# Patient Record
Sex: Male | Born: 1980 | Race: Black or African American | Hispanic: No | Smoking: Never smoker
Health system: Southern US, Community
[De-identification: ages and names within clinical notes are randomized; demographics above are authoritative.]

## PROBLEM LIST (undated history)

## (undated) DIAGNOSIS — F419 Anxiety disorder, unspecified: Secondary | ICD-10-CM

## (undated) DIAGNOSIS — R569 Unspecified convulsions: Secondary | ICD-10-CM

## (undated) DIAGNOSIS — J45909 Unspecified asthma, uncomplicated: Secondary | ICD-10-CM

## (undated) DIAGNOSIS — F329 Major depressive disorder, single episode, unspecified: Secondary | ICD-10-CM

## (undated) DIAGNOSIS — F909 Attention-deficit hyperactivity disorder, unspecified type: Secondary | ICD-10-CM

## (undated) HISTORY — PX: FRACTURE SURGERY: SHX138

---

## 2004-11-21 ENCOUNTER — Emergency Department: Payer: Self-pay | Admitting: Emergency Medicine

## 2007-03-15 ENCOUNTER — Emergency Department: Payer: Self-pay | Admitting: Internal Medicine

## 2007-03-15 ENCOUNTER — Other Ambulatory Visit: Payer: Self-pay

## 2007-07-05 ENCOUNTER — Emergency Department: Payer: Self-pay | Admitting: Emergency Medicine

## 2008-03-01 ENCOUNTER — Emergency Department: Payer: Self-pay | Admitting: Emergency Medicine

## 2008-03-24 ENCOUNTER — Emergency Department: Payer: Self-pay | Admitting: Emergency Medicine

## 2008-03-24 ENCOUNTER — Other Ambulatory Visit: Payer: Self-pay

## 2008-03-25 ENCOUNTER — Inpatient Hospital Stay: Payer: Self-pay | Admitting: Psychiatry

## 2008-03-25 ENCOUNTER — Other Ambulatory Visit: Payer: Self-pay

## 2008-12-17 ENCOUNTER — Emergency Department: Payer: Self-pay | Admitting: Unknown Physician Specialty

## 2009-07-06 ENCOUNTER — Emergency Department: Payer: Self-pay | Admitting: Emergency Medicine

## 2009-07-07 ENCOUNTER — Ambulatory Visit: Payer: Self-pay | Admitting: Emergency Medicine

## 2009-07-07 ENCOUNTER — Emergency Department: Payer: Self-pay | Admitting: Unknown Physician Specialty

## 2009-10-12 ENCOUNTER — Emergency Department: Payer: Self-pay

## 2010-03-10 ENCOUNTER — Emergency Department: Payer: Self-pay | Admitting: Emergency Medicine

## 2010-03-28 ENCOUNTER — Emergency Department: Payer: Self-pay | Admitting: Emergency Medicine

## 2010-04-05 ENCOUNTER — Ambulatory Visit: Payer: Self-pay | Admitting: Neurology

## 2010-06-08 ENCOUNTER — Emergency Department: Payer: Self-pay | Admitting: Internal Medicine

## 2010-08-03 ENCOUNTER — Emergency Department: Payer: Self-pay | Admitting: Emergency Medicine

## 2011-03-30 ENCOUNTER — Emergency Department: Payer: Self-pay | Admitting: Emergency Medicine

## 2011-05-05 ENCOUNTER — Emergency Department: Payer: Self-pay | Admitting: Unknown Physician Specialty

## 2012-03-27 ENCOUNTER — Ambulatory Visit: Payer: Self-pay | Admitting: Internal Medicine

## 2012-12-18 DIAGNOSIS — I10 Essential (primary) hypertension: Secondary | ICD-10-CM | POA: Diagnosis not present

## 2012-12-18 DIAGNOSIS — G4733 Obstructive sleep apnea (adult) (pediatric): Secondary | ICD-10-CM | POA: Diagnosis not present

## 2013-01-08 ENCOUNTER — Ambulatory Visit: Payer: Self-pay | Admitting: Internal Medicine

## 2013-01-08 DIAGNOSIS — G4733 Obstructive sleep apnea (adult) (pediatric): Secondary | ICD-10-CM | POA: Diagnosis not present

## 2013-01-09 DIAGNOSIS — F332 Major depressive disorder, recurrent severe without psychotic features: Secondary | ICD-10-CM | POA: Diagnosis not present

## 2013-01-16 DIAGNOSIS — F332 Major depressive disorder, recurrent severe without psychotic features: Secondary | ICD-10-CM | POA: Diagnosis not present

## 2013-04-11 DIAGNOSIS — F332 Major depressive disorder, recurrent severe without psychotic features: Secondary | ICD-10-CM | POA: Diagnosis not present

## 2013-05-01 DIAGNOSIS — F411 Generalized anxiety disorder: Secondary | ICD-10-CM | POA: Diagnosis not present

## 2013-05-01 DIAGNOSIS — IMO0002 Reserved for concepts with insufficient information to code with codable children: Secondary | ICD-10-CM | POA: Diagnosis not present

## 2013-05-01 DIAGNOSIS — L02419 Cutaneous abscess of limb, unspecified: Secondary | ICD-10-CM | POA: Diagnosis not present

## 2013-05-01 DIAGNOSIS — J45909 Unspecified asthma, uncomplicated: Secondary | ICD-10-CM | POA: Diagnosis not present

## 2013-06-11 DIAGNOSIS — IMO0002 Reserved for concepts with insufficient information to code with codable children: Secondary | ICD-10-CM | POA: Diagnosis not present

## 2013-06-11 DIAGNOSIS — J45909 Unspecified asthma, uncomplicated: Secondary | ICD-10-CM | POA: Diagnosis not present

## 2013-06-11 DIAGNOSIS — F411 Generalized anxiety disorder: Secondary | ICD-10-CM | POA: Diagnosis not present

## 2013-07-01 DIAGNOSIS — J40 Bronchitis, not specified as acute or chronic: Secondary | ICD-10-CM | POA: Diagnosis not present

## 2013-07-01 DIAGNOSIS — J3489 Other specified disorders of nose and nasal sinuses: Secondary | ICD-10-CM | POA: Diagnosis not present

## 2013-07-01 DIAGNOSIS — J45909 Unspecified asthma, uncomplicated: Secondary | ICD-10-CM | POA: Diagnosis not present

## 2013-07-01 DIAGNOSIS — J329 Chronic sinusitis, unspecified: Secondary | ICD-10-CM | POA: Diagnosis not present

## 2013-07-18 DIAGNOSIS — F332 Major depressive disorder, recurrent severe without psychotic features: Secondary | ICD-10-CM | POA: Diagnosis not present

## 2013-07-25 DIAGNOSIS — G4730 Sleep apnea, unspecified: Secondary | ICD-10-CM | POA: Diagnosis not present

## 2013-07-25 DIAGNOSIS — J45909 Unspecified asthma, uncomplicated: Secondary | ICD-10-CM | POA: Diagnosis not present

## 2013-07-25 DIAGNOSIS — M549 Dorsalgia, unspecified: Secondary | ICD-10-CM | POA: Diagnosis not present

## 2013-07-25 DIAGNOSIS — F329 Major depressive disorder, single episode, unspecified: Secondary | ICD-10-CM | POA: Diagnosis not present

## 2013-07-25 DIAGNOSIS — F3289 Other specified depressive episodes: Secondary | ICD-10-CM | POA: Diagnosis not present

## 2013-07-31 DIAGNOSIS — E669 Obesity, unspecified: Secondary | ICD-10-CM | POA: Diagnosis not present

## 2013-08-25 DIAGNOSIS — R7309 Other abnormal glucose: Secondary | ICD-10-CM | POA: Diagnosis not present

## 2013-08-25 DIAGNOSIS — E669 Obesity, unspecified: Secondary | ICD-10-CM | POA: Diagnosis not present

## 2013-08-25 DIAGNOSIS — G4730 Sleep apnea, unspecified: Secondary | ICD-10-CM | POA: Diagnosis not present

## 2013-08-25 DIAGNOSIS — M549 Dorsalgia, unspecified: Secondary | ICD-10-CM | POA: Diagnosis not present

## 2013-09-19 DIAGNOSIS — F332 Major depressive disorder, recurrent severe without psychotic features: Secondary | ICD-10-CM | POA: Diagnosis not present

## 2013-09-25 DIAGNOSIS — J45909 Unspecified asthma, uncomplicated: Secondary | ICD-10-CM | POA: Diagnosis not present

## 2013-09-25 DIAGNOSIS — E669 Obesity, unspecified: Secondary | ICD-10-CM | POA: Diagnosis not present

## 2013-10-30 DIAGNOSIS — Z713 Dietary counseling and surveillance: Secondary | ICD-10-CM | POA: Diagnosis not present

## 2013-10-30 DIAGNOSIS — J45909 Unspecified asthma, uncomplicated: Secondary | ICD-10-CM | POA: Diagnosis not present

## 2013-10-30 DIAGNOSIS — M549 Dorsalgia, unspecified: Secondary | ICD-10-CM | POA: Diagnosis not present

## 2013-11-05 DIAGNOSIS — F332 Major depressive disorder, recurrent severe without psychotic features: Secondary | ICD-10-CM | POA: Diagnosis not present

## 2013-11-05 DIAGNOSIS — F411 Generalized anxiety disorder: Secondary | ICD-10-CM | POA: Diagnosis not present

## 2013-11-12 DIAGNOSIS — F332 Major depressive disorder, recurrent severe without psychotic features: Secondary | ICD-10-CM | POA: Diagnosis not present

## 2013-11-25 DIAGNOSIS — F332 Major depressive disorder, recurrent severe without psychotic features: Secondary | ICD-10-CM | POA: Diagnosis not present

## 2013-12-02 DIAGNOSIS — F332 Major depressive disorder, recurrent severe without psychotic features: Secondary | ICD-10-CM | POA: Diagnosis not present

## 2014-01-17 DIAGNOSIS — F332 Major depressive disorder, recurrent severe without psychotic features: Secondary | ICD-10-CM | POA: Diagnosis not present

## 2014-02-16 DIAGNOSIS — F332 Major depressive disorder, recurrent severe without psychotic features: Secondary | ICD-10-CM | POA: Diagnosis not present

## 2014-03-12 DIAGNOSIS — F332 Major depressive disorder, recurrent severe without psychotic features: Secondary | ICD-10-CM | POA: Diagnosis not present

## 2014-04-01 DIAGNOSIS — E669 Obesity, unspecified: Secondary | ICD-10-CM | POA: Diagnosis not present

## 2014-04-01 DIAGNOSIS — J45909 Unspecified asthma, uncomplicated: Secondary | ICD-10-CM | POA: Diagnosis not present

## 2014-04-01 DIAGNOSIS — F3289 Other specified depressive episodes: Secondary | ICD-10-CM | POA: Diagnosis not present

## 2014-04-01 DIAGNOSIS — L600 Ingrowing nail: Secondary | ICD-10-CM | POA: Diagnosis not present

## 2014-04-01 DIAGNOSIS — G4730 Sleep apnea, unspecified: Secondary | ICD-10-CM | POA: Diagnosis not present

## 2014-04-01 DIAGNOSIS — F329 Major depressive disorder, single episode, unspecified: Secondary | ICD-10-CM | POA: Diagnosis not present

## 2014-04-01 DIAGNOSIS — M549 Dorsalgia, unspecified: Secondary | ICD-10-CM | POA: Diagnosis not present

## 2014-04-10 DIAGNOSIS — L600 Ingrowing nail: Secondary | ICD-10-CM | POA: Diagnosis not present

## 2014-04-10 DIAGNOSIS — L03032 Cellulitis of left toe: Secondary | ICD-10-CM | POA: Diagnosis not present

## 2014-04-14 DIAGNOSIS — R6889 Other general symptoms and signs: Secondary | ICD-10-CM | POA: Diagnosis not present

## 2014-04-16 DIAGNOSIS — F332 Major depressive disorder, recurrent severe without psychotic features: Secondary | ICD-10-CM | POA: Diagnosis not present

## 2014-04-28 DIAGNOSIS — F332 Major depressive disorder, recurrent severe without psychotic features: Secondary | ICD-10-CM | POA: Diagnosis not present

## 2014-04-28 DIAGNOSIS — Z79899 Other long term (current) drug therapy: Secondary | ICD-10-CM | POA: Diagnosis not present

## 2014-05-04 DIAGNOSIS — F332 Major depressive disorder, recurrent severe without psychotic features: Secondary | ICD-10-CM | POA: Diagnosis not present

## 2014-05-19 DIAGNOSIS — F332 Major depressive disorder, recurrent severe without psychotic features: Secondary | ICD-10-CM | POA: Diagnosis not present

## 2014-05-26 DIAGNOSIS — L750 Bromhidrosis: Secondary | ICD-10-CM | POA: Diagnosis not present

## 2014-06-01 DIAGNOSIS — J4520 Mild intermittent asthma, uncomplicated: Secondary | ICD-10-CM | POA: Diagnosis not present

## 2014-06-01 DIAGNOSIS — E669 Obesity, unspecified: Secondary | ICD-10-CM | POA: Diagnosis not present

## 2014-06-01 DIAGNOSIS — G4730 Sleep apnea, unspecified: Secondary | ICD-10-CM | POA: Diagnosis not present

## 2014-06-01 DIAGNOSIS — F329 Major depressive disorder, single episode, unspecified: Secondary | ICD-10-CM | POA: Diagnosis not present

## 2014-06-03 DIAGNOSIS — F332 Major depressive disorder, recurrent severe without psychotic features: Secondary | ICD-10-CM | POA: Diagnosis not present

## 2014-06-10 DIAGNOSIS — F332 Major depressive disorder, recurrent severe without psychotic features: Secondary | ICD-10-CM | POA: Diagnosis not present

## 2014-06-17 DIAGNOSIS — J309 Allergic rhinitis, unspecified: Secondary | ICD-10-CM | POA: Diagnosis not present

## 2014-06-17 DIAGNOSIS — J342 Deviated nasal septum: Secondary | ICD-10-CM | POA: Diagnosis not present

## 2014-06-17 DIAGNOSIS — R0683 Snoring: Secondary | ICD-10-CM | POA: Diagnosis not present

## 2014-07-07 DIAGNOSIS — F332 Major depressive disorder, recurrent severe without psychotic features: Secondary | ICD-10-CM | POA: Diagnosis not present

## 2014-07-14 DIAGNOSIS — F329 Major depressive disorder, single episode, unspecified: Secondary | ICD-10-CM | POA: Diagnosis not present

## 2014-07-14 DIAGNOSIS — Z1389 Encounter for screening for other disorder: Secondary | ICD-10-CM | POA: Diagnosis not present

## 2014-07-14 DIAGNOSIS — J4521 Mild intermittent asthma with (acute) exacerbation: Secondary | ICD-10-CM | POA: Diagnosis not present

## 2014-07-14 DIAGNOSIS — J4520 Mild intermittent asthma, uncomplicated: Secondary | ICD-10-CM | POA: Diagnosis not present

## 2014-07-14 DIAGNOSIS — E669 Obesity, unspecified: Secondary | ICD-10-CM | POA: Diagnosis not present

## 2014-07-16 DIAGNOSIS — F332 Major depressive disorder, recurrent severe without psychotic features: Secondary | ICD-10-CM | POA: Diagnosis not present

## 2014-07-21 DIAGNOSIS — L750 Bromhidrosis: Secondary | ICD-10-CM | POA: Diagnosis not present

## 2014-07-30 DIAGNOSIS — F332 Major depressive disorder, recurrent severe without psychotic features: Secondary | ICD-10-CM | POA: Diagnosis not present

## 2014-08-14 DIAGNOSIS — F332 Major depressive disorder, recurrent severe without psychotic features: Secondary | ICD-10-CM | POA: Diagnosis not present

## 2014-09-03 DIAGNOSIS — F332 Major depressive disorder, recurrent severe without psychotic features: Secondary | ICD-10-CM | POA: Diagnosis not present

## 2014-09-09 DIAGNOSIS — Z1389 Encounter for screening for other disorder: Secondary | ICD-10-CM | POA: Diagnosis not present

## 2014-09-09 DIAGNOSIS — F329 Major depressive disorder, single episode, unspecified: Secondary | ICD-10-CM | POA: Diagnosis not present

## 2014-09-09 DIAGNOSIS — M549 Dorsalgia, unspecified: Secondary | ICD-10-CM | POA: Diagnosis not present

## 2014-09-09 DIAGNOSIS — G8929 Other chronic pain: Secondary | ICD-10-CM | POA: Diagnosis not present

## 2014-09-14 ENCOUNTER — Ambulatory Visit: Payer: Self-pay

## 2014-09-14 DIAGNOSIS — M545 Low back pain: Secondary | ICD-10-CM | POA: Diagnosis not present

## 2014-09-14 DIAGNOSIS — Z23 Encounter for immunization: Secondary | ICD-10-CM | POA: Diagnosis not present

## 2014-09-14 DIAGNOSIS — M549 Dorsalgia, unspecified: Secondary | ICD-10-CM | POA: Diagnosis not present

## 2014-09-14 DIAGNOSIS — Z981 Arthrodesis status: Secondary | ICD-10-CM | POA: Diagnosis not present

## 2014-09-17 DIAGNOSIS — F332 Major depressive disorder, recurrent severe without psychotic features: Secondary | ICD-10-CM | POA: Diagnosis not present

## 2014-10-01 DIAGNOSIS — F332 Major depressive disorder, recurrent severe without psychotic features: Secondary | ICD-10-CM | POA: Diagnosis not present

## 2014-10-08 DIAGNOSIS — F332 Major depressive disorder, recurrent severe without psychotic features: Secondary | ICD-10-CM | POA: Diagnosis not present

## 2014-10-14 ENCOUNTER — Emergency Department
Admit: 2014-10-14 | Disposition: A | Discharge status: Other Acute Inpatient Hospital | Payer: Self-pay | Admitting: Emergency Medicine

## 2014-10-14 DIAGNOSIS — S32059A Unspecified fracture of fifth lumbar vertebra, initial encounter for closed fracture: Secondary | ICD-10-CM | POA: Diagnosis not present

## 2014-10-14 DIAGNOSIS — S329XXA Fracture of unspecified parts of lumbosacral spine and pelvis, initial encounter for closed fracture: Secondary | ICD-10-CM | POA: Diagnosis not present

## 2014-10-14 DIAGNOSIS — R51 Headache: Secondary | ICD-10-CM | POA: Diagnosis not present

## 2014-10-14 DIAGNOSIS — T1490 Injury, unspecified: Secondary | ICD-10-CM | POA: Diagnosis not present

## 2014-10-14 DIAGNOSIS — S199XXA Unspecified injury of neck, initial encounter: Secondary | ICD-10-CM | POA: Diagnosis not present

## 2014-10-14 DIAGNOSIS — M549 Dorsalgia, unspecified: Secondary | ICD-10-CM | POA: Diagnosis not present

## 2014-10-14 DIAGNOSIS — S3992XA Unspecified injury of lower back, initial encounter: Secondary | ICD-10-CM | POA: Diagnosis not present

## 2014-10-14 DIAGNOSIS — M25552 Pain in left hip: Secondary | ICD-10-CM | POA: Diagnosis not present

## 2014-10-14 DIAGNOSIS — S0990XA Unspecified injury of head, initial encounter: Secondary | ICD-10-CM | POA: Diagnosis not present

## 2014-10-14 DIAGNOSIS — S32502A Unspecified fracture of left pubis, initial encounter for closed fracture: Secondary | ICD-10-CM | POA: Diagnosis not present

## 2014-10-14 DIAGNOSIS — M542 Cervicalgia: Secondary | ICD-10-CM | POA: Diagnosis not present

## 2014-10-14 DIAGNOSIS — S32058A Other fracture of fifth lumbar vertebra, initial encounter for closed fracture: Secondary | ICD-10-CM | POA: Diagnosis not present

## 2014-10-14 DIAGNOSIS — S3993XA Unspecified injury of pelvis, initial encounter: Secondary | ICD-10-CM | POA: Diagnosis not present

## 2014-10-14 DIAGNOSIS — S32592A Other specified fracture of left pubis, initial encounter for closed fracture: Secondary | ICD-10-CM | POA: Diagnosis not present

## 2014-10-15 ENCOUNTER — Inpatient Hospital Stay (HOSPITAL_COMMUNITY): Payer: No Typology Code available for payment source

## 2014-10-15 ENCOUNTER — Encounter (HOSPITAL_COMMUNITY)
Admission: EM | Disposition: A | Discharge status: Skilled Nursing Facility | Payer: Self-pay | Source: Other Acute Inpatient Hospital

## 2014-10-15 ENCOUNTER — Inpatient Hospital Stay (HOSPITAL_COMMUNITY): Payer: No Typology Code available for payment source | Admitting: Anesthesiology

## 2014-10-15 ENCOUNTER — Encounter (HOSPITAL_COMMUNITY): Payer: Self-pay | Admitting: *Deleted

## 2014-10-15 ENCOUNTER — Inpatient Hospital Stay (HOSPITAL_COMMUNITY)
Admission: EM | Admit: 2014-10-15 | Discharge: 2014-10-20 | DRG: 516 | Disposition: A | Discharge status: Skilled Nursing Facility | Payer: No Typology Code available for payment source | Source: Other Acute Inpatient Hospital | Attending: General Surgery | Admitting: General Surgery

## 2014-10-15 DIAGNOSIS — R262 Difficulty in walking, not elsewhere classified: Secondary | ICD-10-CM | POA: Diagnosis not present

## 2014-10-15 DIAGNOSIS — Y92410 Unspecified street and highway as the place of occurrence of the external cause: Secondary | ICD-10-CM | POA: Diagnosis not present

## 2014-10-15 DIAGNOSIS — S329XXA Fracture of unspecified parts of lumbosacral spine and pelvis, initial encounter for closed fracture: Secondary | ICD-10-CM

## 2014-10-15 DIAGNOSIS — S32592A Other specified fracture of left pubis, initial encounter for closed fracture: Secondary | ICD-10-CM | POA: Diagnosis not present

## 2014-10-15 DIAGNOSIS — S0990XA Unspecified injury of head, initial encounter: Secondary | ICD-10-CM | POA: Diagnosis not present

## 2014-10-15 DIAGNOSIS — S3282XD Multiple fractures of pelvis without disruption of pelvic ring, subsequent encounter for fracture with routine healing: Secondary | ICD-10-CM | POA: Diagnosis not present

## 2014-10-15 DIAGNOSIS — S32058A Other fracture of fifth lumbar vertebra, initial encounter for closed fracture: Secondary | ICD-10-CM | POA: Diagnosis not present

## 2014-10-15 DIAGNOSIS — G40909 Epilepsy, unspecified, not intractable, without status epilepticus: Secondary | ICD-10-CM | POA: Diagnosis not present

## 2014-10-15 DIAGNOSIS — M6281 Muscle weakness (generalized): Secondary | ICD-10-CM | POA: Diagnosis not present

## 2014-10-15 DIAGNOSIS — F339 Major depressive disorder, recurrent, unspecified: Secondary | ICD-10-CM | POA: Diagnosis not present

## 2014-10-15 DIAGNOSIS — S32810A Multiple fractures of pelvis with stable disruption of pelvic ring, initial encounter for closed fracture: Principal | ICD-10-CM | POA: Diagnosis present

## 2014-10-15 DIAGNOSIS — R339 Retention of urine, unspecified: Secondary | ICD-10-CM | POA: Diagnosis not present

## 2014-10-15 DIAGNOSIS — S334XXA Traumatic rupture of symphysis pubis, initial encounter: Secondary | ICD-10-CM | POA: Diagnosis not present

## 2014-10-15 DIAGNOSIS — S32059A Unspecified fracture of fifth lumbar vertebra, initial encounter for closed fracture: Secondary | ICD-10-CM | POA: Diagnosis present

## 2014-10-15 DIAGNOSIS — F329 Major depressive disorder, single episode, unspecified: Secondary | ICD-10-CM | POA: Diagnosis present

## 2014-10-15 DIAGNOSIS — D62 Acute posthemorrhagic anemia: Secondary | ICD-10-CM | POA: Diagnosis not present

## 2014-10-15 DIAGNOSIS — Y9301 Activity, walking, marching and hiking: Secondary | ICD-10-CM

## 2014-10-15 DIAGNOSIS — S32810B Multiple fractures of pelvis with stable disruption of pelvic ring, initial encounter for open fracture: Secondary | ICD-10-CM

## 2014-10-15 DIAGNOSIS — R102 Pelvic and perineal pain: Secondary | ICD-10-CM | POA: Diagnosis not present

## 2014-10-15 DIAGNOSIS — S32811A Multiple fractures of pelvis with unstable disruption of pelvic ring, initial encounter for closed fracture: Secondary | ICD-10-CM | POA: Diagnosis not present

## 2014-10-15 DIAGNOSIS — F419 Anxiety disorder, unspecified: Secondary | ICD-10-CM | POA: Diagnosis not present

## 2014-10-15 DIAGNOSIS — D500 Iron deficiency anemia secondary to blood loss (chronic): Secondary | ICD-10-CM | POA: Diagnosis not present

## 2014-10-15 HISTORY — DX: Unspecified asthma, uncomplicated: J45.909

## 2014-10-15 HISTORY — PX: ORIF PELVIC FRACTURE: SHX2128

## 2014-10-15 HISTORY — DX: Anxiety disorder, unspecified: F41.9

## 2014-10-15 HISTORY — DX: Unspecified convulsions: R56.9

## 2014-10-15 HISTORY — PX: SACRO-ILIAC PINNING: SHX5050

## 2014-10-15 HISTORY — DX: Attention-deficit hyperactivity disorder, unspecified type: F90.9

## 2014-10-15 HISTORY — DX: Major depressive disorder, single episode, unspecified: F32.9

## 2014-10-15 LAB — CBC WITH DIFFERENTIAL/PLATELET
BASOS ABS: 0.1 10*3/uL (ref 0.0–0.1)
Basophil %: 0.4 %
EOS ABS: 0 10*3/uL (ref 0.0–0.7)
Eosinophil %: 0 %
HCT: 43 % (ref 40.0–52.0)
HGB: 14.2 g/dL (ref 13.0–18.0)
LYMPHS PCT: 6.5 %
Lymphocyte #: 1 10*3/uL (ref 1.0–3.6)
MCH: 30.7 pg (ref 26.0–34.0)
MCHC: 33.1 g/dL (ref 32.0–36.0)
MCV: 93 fL (ref 80–100)
Monocyte #: 0.9 x10 3/mm (ref 0.2–1.0)
Monocyte %: 6.2 %
NEUTROS PCT: 86.9 %
Neutrophil #: 12.8 10*3/uL — ABNORMAL HIGH (ref 1.4–6.5)
Platelet: 212 10*3/uL (ref 150–440)
RBC: 4.64 10*6/uL (ref 4.40–5.90)
RDW: 12.8 % (ref 11.5–14.5)
WBC: 14.7 10*3/uL — AB (ref 3.8–10.6)

## 2014-10-15 LAB — CBC
HCT: 38 % — ABNORMAL LOW (ref 39.0–52.0)
Hemoglobin: 12.9 g/dL — ABNORMAL LOW (ref 13.0–17.0)
MCH: 30.9 pg (ref 26.0–34.0)
MCHC: 33.9 g/dL (ref 30.0–36.0)
MCV: 90.9 fL (ref 78.0–100.0)
PLATELETS: 199 10*3/uL (ref 150–400)
RBC: 4.18 MIL/uL — ABNORMAL LOW (ref 4.22–5.81)
RDW: 12.7 % (ref 11.5–15.5)
WBC: 8.9 10*3/uL (ref 4.0–10.5)

## 2014-10-15 LAB — COMPREHENSIVE METABOLIC PANEL
ALBUMIN: 3.4 g/dL — AB (ref 3.5–5.2)
ALT: 23 U/L (ref 0–53)
ANION GAP: 7 (ref 5–15)
AST: 39 U/L — AB (ref 0–37)
Alkaline Phosphatase: 65 U/L (ref 39–117)
BILIRUBIN TOTAL: 1.1 mg/dL (ref 0.3–1.2)
BUN: 8 mg/dL (ref 6–23)
CALCIUM: 8.2 mg/dL — AB (ref 8.4–10.5)
CO2: 27 mmol/L (ref 19–32)
CREATININE: 1.09 mg/dL (ref 0.50–1.35)
Chloride: 107 mmol/L (ref 96–112)
GFR calc Af Amer: >90 mL/min (ref >90)
GFR calc non Af Amer: 87 mL/min — ABNORMAL LOW (ref >90)
Glucose, Bld: 84 mg/dL (ref 70–99)
Potassium: 3.7 mmol/L (ref 3.5–5.1)
SODIUM: 141 mmol/L (ref 135–145)
TOTAL PROTEIN: 6.2 g/dL (ref 6.0–8.3)

## 2014-10-15 LAB — URINE MICROSCOPIC-ADD ON

## 2014-10-15 LAB — ABO/RH: ABO/RH(D): O POS

## 2014-10-15 LAB — URINALYSIS, ROUTINE W REFLEX MICROSCOPIC
GLUCOSE, UA: NEGATIVE mg/dL
Ketones, ur: 15 mg/dL — AB
Leukocytes, UA: NEGATIVE
Nitrite: NEGATIVE
PH: 5.5 (ref 5.0–8.0)
Protein, ur: 100 mg/dL — AB
SPECIFIC GRAVITY, URINE: 1.03 (ref 1.005–1.030)
Urobilinogen, UA: 1 mg/dL (ref 0.0–1.0)

## 2014-10-15 LAB — BASIC METABOLIC PANEL
ANION GAP: 8 (ref 7–16)
BUN: 15 mg/dL
CALCIUM: 9.1 mg/dL
Chloride: 104 mmol/L
Co2: 24 mmol/L
Creatinine: 1.06 mg/dL
EGFR (African American): >60
Glucose: 110 mg/dL — ABNORMAL HIGH
POTASSIUM: 3.8 mmol/L
Sodium: 136 mmol/L

## 2014-10-15 LAB — PROTIME-INR
INR: 1.1
INR: 1.18 (ref 0.00–1.49)
PROTHROMBIN TIME: 15.1 s (ref 11.6–15.2)
Prothrombin Time: 13.9 secs

## 2014-10-15 LAB — APTT: aPTT: 34 seconds (ref 24–37)

## 2014-10-15 LAB — TYPE AND SCREEN
ABO/RH(D): O POS
Antibody Screen: NEGATIVE

## 2014-10-15 LAB — MRSA PCR SCREENING: MRSA by PCR: NEGATIVE

## 2014-10-15 SURGERY — OPEN REDUCTION INTERNAL FIXATION (ORIF) PELVIC FRACTURE
Anesthesia: General | Site: Pelvis

## 2014-10-15 MED ORDER — SODIUM CHLORIDE 0.9 % IV BOLUS (SEPSIS)
1000.0000 mL | Freq: Once | INTRAVENOUS | Status: AC
  Administered 2014-10-15: 1000 mL via INTRAVENOUS

## 2014-10-15 MED ORDER — OXYCODONE-ACETAMINOPHEN 5-325 MG PO TABS
1.0000 | ORAL_TABLET | Freq: Four times a day (QID) | ORAL | Status: DC | PRN
  Administered 2014-10-16 – 2014-10-18 (×6): 2 via ORAL
  Filled 2014-10-15 (×7): qty 2

## 2014-10-15 MED ORDER — MIDAZOLAM HCL 5 MG/5ML IJ SOLN
INTRAMUSCULAR | Status: DC | PRN
  Administered 2014-10-15: 2 mg via INTRAVENOUS

## 2014-10-15 MED ORDER — LACTATED RINGERS IV SOLN
INTRAVENOUS | Status: DC
  Administered 2014-10-15: 14:00:00 via INTRAVENOUS

## 2014-10-15 MED ORDER — CEFAZOLIN SODIUM 1-5 GM-% IV SOLN
INTRAVENOUS | Status: AC
  Filled 2014-10-15: qty 50

## 2014-10-15 MED ORDER — ROCURONIUM BROMIDE 100 MG/10ML IV SOLN
INTRAVENOUS | Status: DC | PRN
Start: 2014-10-15 — End: 2014-10-15
  Administered 2014-10-15: 20 mg via INTRAVENOUS
  Administered 2014-10-15: 50 mg via INTRAVENOUS

## 2014-10-15 MED ORDER — PROPOFOL 10 MG/ML IV BOLUS
INTRAVENOUS | Status: DC | PRN
  Administered 2014-10-15: 140 mg via INTRAVENOUS

## 2014-10-15 MED ORDER — ONDANSETRON HCL 4 MG/2ML IJ SOLN: 4.0000 mg | Freq: Once | INTRAMUSCULAR | Status: DC | PRN

## 2014-10-15 MED ORDER — PROPOFOL 10 MG/ML IV BOLUS
INTRAVENOUS | Status: AC
  Filled 2014-10-15: qty 20

## 2014-10-15 MED ORDER — DEXTROSE 5 % IV SOLN
3.0000 g | Freq: Once | INTRAVENOUS | Status: AC
  Administered 2014-10-15: 3 g via INTRAVENOUS
  Filled 2014-10-15: qty 3000

## 2014-10-15 MED ORDER — GLYCOPYRROLATE 0.2 MG/ML IJ SOLN
INTRAMUSCULAR | Status: AC
  Filled 2014-10-15: qty 2

## 2014-10-15 MED ORDER — GLYCOPYRROLATE 0.2 MG/ML IJ SOLN
INTRAMUSCULAR | Status: DC | PRN
  Administered 2014-10-15: 0.6 mg via INTRAVENOUS

## 2014-10-15 MED ORDER — HYDROMORPHONE HCL 1 MG/ML IJ SOLN
INTRAMUSCULAR | Status: AC
  Filled 2014-10-15: qty 1

## 2014-10-15 MED ORDER — ROCURONIUM BROMIDE 50 MG/5ML IV SOLN
INTRAVENOUS | Status: AC
  Filled 2014-10-15: qty 1

## 2014-10-15 MED ORDER — ONDANSETRON HCL 4 MG/2ML IJ SOLN
INTRAMUSCULAR | Status: AC
  Filled 2014-10-15: qty 2

## 2014-10-15 MED ORDER — HYDROMORPHONE HCL 1 MG/ML IJ SOLN
0.2500 mg | INTRAMUSCULAR | Status: DC | PRN
Start: 2014-10-15 — End: 2014-10-15
  Administered 2014-10-15 (×4): 0.5 mg via INTRAVENOUS

## 2014-10-15 MED ORDER — FENTANYL CITRATE 0.05 MG/ML IJ SOLN
INTRAMUSCULAR | Status: AC
  Filled 2014-10-15: qty 5

## 2014-10-15 MED ORDER — ENOXAPARIN SODIUM 60 MG/0.6ML ~~LOC~~ SOLN
0.5000 mg/kg | SUBCUTANEOUS | Status: DC
  Administered 2014-10-15 – 2014-10-19 (×5): 60 mg via SUBCUTANEOUS
  Filled 2014-10-15 (×10): qty 0.6

## 2014-10-15 MED ORDER — LACTATED RINGERS IV SOLN
INTRAVENOUS | Status: DC | PRN
  Administered 2014-10-15 (×3): via INTRAVENOUS

## 2014-10-15 MED ORDER — EPHEDRINE SULFATE 50 MG/ML IJ SOLN
INTRAMUSCULAR | Status: AC
  Filled 2014-10-15: qty 1

## 2014-10-15 MED ORDER — ARTIFICIAL TEARS OP OINT
TOPICAL_OINTMENT | OPHTHALMIC | Status: DC | PRN
  Administered 2014-10-15: 1 via OPHTHALMIC

## 2014-10-15 MED ORDER — CEFAZOLIN SODIUM-DEXTROSE 2-3 GM-% IV SOLR
2.0000 g | Freq: Three times a day (TID) | INTRAVENOUS | Status: AC
  Administered 2014-10-15 – 2014-10-16 (×3): 2 g via INTRAVENOUS
  Filled 2014-10-15 (×3): qty 50

## 2014-10-15 MED ORDER — OXYCODONE HCL 5 MG PO TABS
5.0000 mg | ORAL_TABLET | ORAL | Status: DC | PRN
Start: 2014-10-15 — End: 2014-10-19
  Administered 2014-10-15 – 2014-10-18 (×7): 10 mg via ORAL
  Filled 2014-10-15 (×7): qty 2

## 2014-10-15 MED ORDER — METHOCARBAMOL 500 MG PO TABS
1000.0000 mg | ORAL_TABLET | Freq: Four times a day (QID) | ORAL | Status: DC | PRN
  Administered 2014-10-17 – 2014-10-18 (×5): 1000 mg via ORAL
  Filled 2014-10-15 (×5): qty 2

## 2014-10-15 MED ORDER — NEOSTIGMINE METHYLSULFATE 10 MG/10ML IV SOLN
INTRAVENOUS | Status: DC | PRN
  Administered 2014-10-15: 4 mg via INTRAVENOUS

## 2014-10-15 MED ORDER — METHOCARBAMOL 1000 MG/10ML IJ SOLN
1000.0000 mg | Freq: Four times a day (QID) | INTRAMUSCULAR | Status: DC | PRN
  Filled 2014-10-15: qty 10

## 2014-10-15 MED ORDER — 0.9 % SODIUM CHLORIDE (POUR BTL) OPTIME
TOPICAL | Status: DC | PRN
  Administered 2014-10-15: 1000 mL

## 2014-10-15 MED ORDER — SODIUM CHLORIDE 0.9 % IV SOLN
INTRAVENOUS | Status: DC
  Administered 2014-10-15 – 2014-10-16 (×4): via INTRAVENOUS

## 2014-10-15 MED ORDER — LIDOCAINE HCL (CARDIAC) 20 MG/ML IV SOLN
INTRAVENOUS | Status: DC | PRN
  Administered 2014-10-15: 100 mg via INTRAVENOUS

## 2014-10-15 MED ORDER — ONDANSETRON HCL 4 MG PO TABS: 4.0000 mg | ORAL_TABLET | Freq: Four times a day (QID) | ORAL | Status: DC | PRN

## 2014-10-15 MED ORDER — LIDOCAINE HCL (CARDIAC) 20 MG/ML IV SOLN
INTRAVENOUS | Status: AC
  Filled 2014-10-15: qty 5

## 2014-10-15 MED ORDER — ONDANSETRON HCL 4 MG/2ML IJ SOLN
INTRAMUSCULAR | Status: DC | PRN
  Administered 2014-10-15: 4 mg via INTRAVENOUS

## 2014-10-15 MED ORDER — STERILE WATER FOR INJECTION IJ SOLN
INTRAMUSCULAR | Status: AC
  Filled 2014-10-15: qty 20

## 2014-10-15 MED ORDER — NEOSTIGMINE METHYLSULFATE 10 MG/10ML IV SOLN
INTRAVENOUS | Status: AC
  Filled 2014-10-15: qty 1

## 2014-10-15 MED ORDER — ONDANSETRON HCL 4 MG/2ML IJ SOLN: 4.0000 mg | Freq: Four times a day (QID) | INTRAMUSCULAR | Status: DC | PRN

## 2014-10-15 MED ORDER — MIDAZOLAM HCL 2 MG/2ML IJ SOLN
INTRAMUSCULAR | Status: AC
  Filled 2014-10-15: qty 2

## 2014-10-15 MED ORDER — CEFAZOLIN SODIUM-DEXTROSE 2-3 GM-% IV SOLR
INTRAVENOUS | Status: AC
  Filled 2014-10-15: qty 50

## 2014-10-15 MED ORDER — PANTOPRAZOLE SODIUM 40 MG PO TBEC
40.0000 mg | DELAYED_RELEASE_TABLET | Freq: Every day | ORAL | Status: DC
  Administered 2014-10-16 – 2014-10-18 (×3): 40 mg via ORAL
  Filled 2014-10-15 (×3): qty 1

## 2014-10-15 MED ORDER — HYDROMORPHONE HCL 1 MG/ML IJ SOLN
1.0000 mg | INTRAMUSCULAR | Status: DC | PRN
  Administered 2014-10-15: 1 mg via INTRAVENOUS
  Administered 2014-10-15: 2 mg via INTRAVENOUS
  Administered 2014-10-15: 1 mg via INTRAVENOUS
  Administered 2014-10-15: 2 mg via INTRAVENOUS
  Administered 2014-10-15: 1 mg via INTRAVENOUS
  Administered 2014-10-16 (×2): 2 mg via INTRAVENOUS
  Administered 2014-10-16 – 2014-10-17 (×3): 1 mg via INTRAVENOUS
  Filled 2014-10-15: qty 2
  Filled 2014-10-15 (×2): qty 1
  Filled 2014-10-15: qty 2
  Filled 2014-10-15 (×2): qty 1
  Filled 2014-10-15: qty 2
  Filled 2014-10-15: qty 1
  Filled 2014-10-15: qty 2
  Filled 2014-10-15 (×2): qty 1

## 2014-10-15 MED ORDER — FENTANYL CITRATE (PF) 100 MCG/2ML IJ SOLN
INTRAMUSCULAR | Status: DC | PRN
  Administered 2014-10-15 (×3): 50 ug via INTRAVENOUS
  Administered 2014-10-15: 100 ug via INTRAVENOUS

## 2014-10-15 MED ORDER — PANTOPRAZOLE SODIUM 40 MG IV SOLR
40.0000 mg | Freq: Every day | INTRAVENOUS | Status: DC
  Administered 2014-10-15: 40 mg via INTRAVENOUS
  Filled 2014-10-15 (×3): qty 40

## 2014-10-15 MED ORDER — MEPERIDINE HCL 25 MG/ML IJ SOLN: 6.2500 mg | INTRAMUSCULAR | Status: DC | PRN

## 2014-10-15 SURGICAL SUPPLY — 74 items
BIT DRILL AO MATTA 2.5MX230M (BIT) ×2 IMPLANT
BLADE SURG 15 STRL LF DISP TIS (BLADE) IMPLANT
BLADE SURG 15 STRL SS (BLADE)
BLADE SURG ROTATE 9660 (MISCELLANEOUS) IMPLANT
BRUSH SCRUB DISP (MISCELLANEOUS) ×8 IMPLANT
COVER SURGICAL LIGHT HANDLE (MISCELLANEOUS) ×8 IMPLANT
DRAIN CHANNEL 15F RND FF W/TCR (WOUND CARE) IMPLANT
DRAPE C-ARM 42X72 X-RAY (DRAPES) ×4 IMPLANT
DRAPE C-ARMOR (DRAPES) ×4 IMPLANT
DRAPE IMP U-DRAPE 54X76 (DRAPES) IMPLANT
DRAPE INCISE IOBAN 66X45 STRL (DRAPES) ×4 IMPLANT
DRAPE LAPAROTOMY TRNSV 102X78 (DRAPE) ×4 IMPLANT
DRAPE U-SHAPE 47X51 STRL (DRAPES) ×8 IMPLANT
DRILL BIT AO MATTA 2.5MX230M (BIT) ×4
DRILL BIT CANN (BIT) ×4 IMPLANT
DRSG ADAPTIC 3X8 NADH LF (GAUZE/BANDAGES/DRESSINGS) IMPLANT
DRSG AQUACEL AG ADV 3.5X10 (GAUZE/BANDAGES/DRESSINGS) ×4 IMPLANT
DRSG PAD ABDOMINAL 8X10 ST (GAUZE/BANDAGES/DRESSINGS) IMPLANT
ELECT BLADE 4.0 EZ CLEAN MEGAD (MISCELLANEOUS) ×4
ELECT REM PT RETURN 9FT ADLT (ELECTROSURGICAL) ×4
ELECTRODE BLDE 4.0 EZ CLN MEGD (MISCELLANEOUS) ×2 IMPLANT
ELECTRODE REM PT RTRN 9FT ADLT (ELECTROSURGICAL) ×2 IMPLANT
EVACUATOR SILICONE 100CC (DRAIN) IMPLANT
GAUZE SPONGE 4X4 12PLY STRL (GAUZE/BANDAGES/DRESSINGS) IMPLANT
GAUZE XEROFORM 5X9 LF (GAUZE/BANDAGES/DRESSINGS) IMPLANT
GLOVE BIO SURGEON STRL SZ 6.5 (GLOVE) ×6 IMPLANT
GLOVE BIO SURGEON STRL SZ7.5 (GLOVE) ×4 IMPLANT
GLOVE BIO SURGEON STRL SZ8 (GLOVE) ×8 IMPLANT
GLOVE BIO SURGEONS STRL SZ 6.5 (GLOVE) ×2
GLOVE BIOGEL PI IND STRL 6.5 (GLOVE) ×2 IMPLANT
GLOVE BIOGEL PI IND STRL 7.0 (GLOVE) ×2 IMPLANT
GLOVE BIOGEL PI IND STRL 7.5 (GLOVE) ×2 IMPLANT
GLOVE BIOGEL PI IND STRL 8 (GLOVE) ×2 IMPLANT
GLOVE BIOGEL PI INDICATOR 6.5 (GLOVE) ×2
GLOVE BIOGEL PI INDICATOR 7.0 (GLOVE) ×2
GLOVE BIOGEL PI INDICATOR 7.5 (GLOVE) ×2
GLOVE BIOGEL PI INDICATOR 8 (GLOVE) ×2
GOWN STRL REUS W/ TWL LRG LVL3 (GOWN DISPOSABLE) ×4 IMPLANT
GOWN STRL REUS W/ TWL XL LVL3 (GOWN DISPOSABLE) ×2 IMPLANT
GOWN STRL REUS W/TWL LRG LVL3 (GOWN DISPOSABLE) ×4
GOWN STRL REUS W/TWL XL LVL3 (GOWN DISPOSABLE) ×2
GUIDEPIN 2.8X300 THRD TIP OIC (Screw) ×4 IMPLANT
KIT BASIN OR (CUSTOM PROCEDURE TRAY) ×4 IMPLANT
KIT ROOM TURNOVER OR (KITS) ×4 IMPLANT
MANIFOLD NEPTUNE II (INSTRUMENTS) ×4 IMPLANT
NS IRRIG 1000ML POUR BTL (IV SOLUTION) ×4 IMPLANT
PACK GENERAL/GYN (CUSTOM PROCEDURE TRAY) IMPLANT
PACK TOTAL JOINT (CUSTOM PROCEDURE TRAY) ×4 IMPLANT
PACK UNIVERSAL I (CUSTOM PROCEDURE TRAY) ×4 IMPLANT
PAD ARMBOARD 7.5X6 YLW CONV (MISCELLANEOUS) ×8 IMPLANT
PLATE SYMPHYSIS 92.5M 6H (Plate) ×4 IMPLANT
SCREW CANN 7.3X145 32MM THRD (Screw) ×4 IMPLANT
SCREW CORTEX ST MATTA 3.5X28MM (Screw) ×8 IMPLANT
SCREW CORTEX ST MATTA 3.5X38M (Screw) ×4 IMPLANT
SCREW CORTEX ST MATTA 3.5X40MM (Screw) ×4 IMPLANT
SCREW CORTEX ST MATTA 3.5X60MM (Screw) ×4 IMPLANT
SCREW CORTEX ST MATTA 3.5X70MM (Screw) ×4 IMPLANT
SPONGE LAP 18X18 X RAY DECT (DISPOSABLE) IMPLANT
STAPLER VISISTAT 35W (STAPLE) ×4 IMPLANT
SUCTION FRAZIER TIP 10 FR DISP (SUCTIONS) IMPLANT
SUT ETHILON 3 0 PS 1 (SUTURE) ×4 IMPLANT
SUT VIC AB 0 CT1 27 (SUTURE) ×2
SUT VIC AB 0 CT1 27XBRD ANBCTR (SUTURE) ×2 IMPLANT
SUT VIC AB 1 CT1 18XCR BRD 8 (SUTURE) ×2 IMPLANT
SUT VIC AB 1 CT1 8-18 (SUTURE) ×2
SUT VIC AB 2-0 CT1 27 (SUTURE) ×2
SUT VIC AB 2-0 CT1 TAPERPNT 27 (SUTURE) ×2 IMPLANT
SUT VIC AB 2-0 FS1 27 (SUTURE) ×4 IMPLANT
TOWEL OR 17X24 6PK STRL BLUE (TOWEL DISPOSABLE) ×4 IMPLANT
TOWEL OR 17X26 10 PK STRL BLUE (TOWEL DISPOSABLE) ×8 IMPLANT
TRAY FOLEY CATH 16FRSI W/METER (SET/KITS/TRAYS/PACK) IMPLANT
UNDERPAD 30X30 INCONTINENT (UNDERPADS AND DIAPERS) ×4 IMPLANT
WASHER OIC 13MM 6 PACK (Screw) ×4 IMPLANT
WATER STERILE IRR 1000ML POUR (IV SOLUTION) ×4 IMPLANT
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
## 2014-10-15 NOTE — Anesthesia Preprocedure Evaluation (Addendum)
Anesthesia Evaluation  Patient identified by MRN, date of birth, ID band Patient awake    Reviewed: Allergy & Precautions, NPO status , Patient's Chart, lab work & pertinent test results  Airway Mallampati: II  TM Distance: >3 FB Neck ROM: Full    Dental   Pulmonary asthma ,    Pulmonary exam normal       Cardiovascular     Neuro/Psych Anxiety Depression    GI/Hepatic   Endo/Other    Renal/GU      Musculoskeletal   Abdominal   Peds  Hematology   Anesthesia Other Findings   Reproductive/Obstetrics                            Anesthesia Physical Anesthesia Plan  ASA: III  Anesthesia Plan: General   Post-op Pain Management:    Induction: Intravenous  Airway Management Planned: Oral ETT  Additional Equipment:   Intra-op Plan:   Post-operative Plan: Extubation in OR  Informed Consent: I have reviewed the patients History and Physical, chart, labs and discussed the procedure including the risks, benefits and alternatives for the proposed anesthesia with the patient or authorized representative who has indicated his/her understanding and acceptance.     Plan Discussed with: CRNA and Surgeon  Anesthesia Plan Comments:         Anesthesia Quick Evaluation

## 2014-10-15 NOTE — Progress Notes (Signed)
UR completed.  Renley Gutman, RN BSN MHA CCM Trauma/Neuro ICU Case Manager [phone]  
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
## 2014-10-15 NOTE — Anesthesia Procedure Notes (Addendum)
Procedure Name: Intubation Date/Time: 10/15/2014 2:57 PM Performed by: Fransisca KaufmannMEYER, Juanya Villavicencio E Pre-anesthesia Checklist: Patient identified, Emergency Drugs available, Suction available, Patient being monitored and Timeout performed Patient Re-evaluated:Patient Re-evaluated prior to inductionOxygen Delivery Method: Circle system utilized Preoxygenation: Pre-oxygenation with 100% oxygen Intubation Type: IV induction Ventilation: Mask ventilation without difficulty Laryngoscope Size: Mac and 4 Grade View: Grade III Tube type: Oral Tube size: 7.5 mm Number of attempts: 1 Airway Equipment and Method: Stylet Placement Confirmation: ETT inserted through vocal cords under direct vision,  positive ETCO2 and breath sounds checked- equal and bilateral Secured at: 23 cm Tube secured with: Tape Dental Injury: Teeth and Oropharynx as per pre-operative assessment

## 2014-10-15 NOTE — Anesthesia Postprocedure Evaluation (Signed)
  Anesthesia Post-op Note  Patient: Derrick Gutierrez  Procedure(s) Performed: Procedure(s): OPEN REDUCTION INTERNAL FIXATION (ORIF) PELVIC FRACTURE (N/A) SACRO-ILIAC PINNING LEFT SIDE (Left)  Patient Location: PACU  Anesthesia Type: General   Level of Consciousness: awake, alert  and oriented  Airway and Oxygen Therapy: Patient Spontanous Breathing  Post-op Pain: moderate  Post-op Assessment: Post-op Vital signs reviewed  Post-op Vital Signs: Reviewed  Last Vitals:  Filed Vitals:   10/15/14 2100  BP: 93/31  Pulse: 114  Temp:   Resp: 12    Complications: No apparent anesthesia complications

## 2014-10-15 NOTE — Transfer of Care (Signed)
Immediate Anesthesia Transfer of Care Note  Patient: Derrick Gutierrez  Procedure(s) Performed: Procedure(s): OPEN REDUCTION INTERNAL FIXATION (ORIF) PELVIC FRACTURE (N/A) SACRO-ILIAC PINNING LEFT SIDE (Left)  Patient Location: PACU  Anesthesia Type:General  Level of Consciousness: awake, alert , oriented and sedated  Airway & Oxygen Therapy: Patient Spontanous Breathing and Patient connected to nasal cannula oxygen  Post-op Assessment: Report given to RN, Post -op Vital signs reviewed and stable and Patient moving all extremities  Post vital signs: Reviewed and stable  Last Vitals:  Filed Vitals:   10/15/14 1358  BP: 116/70  Pulse: 102  Temp:   Resp: 14    Complications: No apparent anesthesia complications

## 2014-10-15 NOTE — Progress Notes (Signed)
Patient ID: Derrick Gutierrez, male   DOB: 1980/12/10, 34 y.o.   MRN: [id number] Just admitted by Dr. Lindie SpruceWyatt at 0530. Chest and abdominal exams unremarkable. Tender at symphysis. Going to OR today for ORIF pelvis by Dr. Carola FrostHandy. U/O has decreased - give bolus now. Labs are pending. [name], MD, MPH, FACS Trauma: [phone] General Surgery: [phone]

## 2014-10-16 LAB — BASIC METABOLIC PANEL
Anion gap: 7 (ref 5–15)
BUN: 6 mg/dL (ref 6–23)
CALCIUM: 8.2 mg/dL — AB (ref 8.4–10.5)
CO2: 24 mmol/L (ref 19–32)
CREATININE: 0.95 mg/dL (ref 0.50–1.35)
Chloride: 106 mmol/L (ref 96–112)
GFR calc Af Amer: >90 mL/min (ref >90)
GFR calc non Af Amer: >90 mL/min (ref >90)
Glucose, Bld: 107 mg/dL — ABNORMAL HIGH (ref 70–99)
Potassium: 3.5 mmol/L (ref 3.5–5.1)
Sodium: 137 mmol/L (ref 135–145)

## 2014-10-16 LAB — CBC
HCT: 34.9 % — ABNORMAL LOW (ref 39.0–52.0)
Hemoglobin: 11.8 g/dL — ABNORMAL LOW (ref 13.0–17.0)
MCH: 30.4 pg (ref 26.0–34.0)
MCHC: 33.8 g/dL (ref 30.0–36.0)
MCV: 89.9 fL (ref 78.0–100.0)
PLATELETS: 195 10*3/uL (ref 150–400)
RBC: 3.88 MIL/uL — AB (ref 4.22–5.81)
RDW: 12.6 % (ref 11.5–15.5)
WBC: 9.1 10*3/uL (ref 4.0–10.5)

## 2014-10-16 MED ORDER — WARFARIN VIDEO
Freq: Once | Status: AC
  Administered 2014-10-17: 18:00:00

## 2014-10-16 MED ORDER — WARFARIN - PHARMACIST DOSING INPATIENT
Freq: Every day | Status: DC
  Administered 2014-10-16 – 2014-10-17 (×2)

## 2014-10-16 MED ORDER — COUMADIN BOOK
Freq: Once | Status: AC
Start: 2014-10-16 — End: 2014-10-16
  Administered 2014-10-16: 17:00:00
  Filled 2014-10-16: qty 1

## 2014-10-16 MED ORDER — BUPROPION HCL ER (XL) 150 MG PO TB24
300.0000 mg | ORAL_TABLET | Freq: Every morning | ORAL | Status: DC
  Administered 2014-10-16 – 2014-10-20 (×5): 300 mg via ORAL
  Filled 2014-10-16: qty 1
  Filled 2014-10-16 (×4): qty 2

## 2014-10-16 MED ORDER — CITALOPRAM HYDROBROMIDE 40 MG PO TABS
40.0000 mg | ORAL_TABLET | Freq: Every day | ORAL | Status: DC
  Administered 2014-10-16 – 2014-10-20 (×5): 40 mg via ORAL
  Filled 2014-10-16 (×5): qty 1

## 2014-10-16 MED ORDER — WARFARIN SODIUM 10 MG PO TABS
10.0000 mg | ORAL_TABLET | Freq: Once | ORAL | Status: AC
  Administered 2014-10-16: 10 mg via ORAL
  Filled 2014-10-16: qty 1

## 2014-10-16 MED ORDER — TRAZODONE HCL 100 MG PO TABS
100.0000 mg | ORAL_TABLET | Freq: Every evening | ORAL | Status: DC | PRN
  Administered 2014-10-17 – 2014-10-19 (×3): 100 mg via ORAL
  Filled 2014-10-16 (×4): qty 1

## 2014-10-16 MED ORDER — SODIUM CHLORIDE 0.9 % IV BOLUS (SEPSIS)
1000.0000 mL | Freq: Once | INTRAVENOUS | Status: AC
  Administered 2014-10-16: 1000 mL via INTRAVENOUS

## 2014-10-16 MED ORDER — SODIUM CHLORIDE 0.9 % IV SOLN
INTRAVENOUS | Status: DC
Start: 2014-10-16 — End: 2014-10-19
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
## 2014-10-16 NOTE — Progress Notes (Signed)
Trauma Service Note  Subjective: Patient agitated this AM and seems to be withdrawing from his psychiatric medication.  Objective: Vital signs in last 24 hours: Temp:  [98.4 F (36.9 C)-99.9 F (37.7 C)] 98.4 F (36.9 C) (04/15 0741) Pulse Rate:  [99-134] 132 (04/15 0700) Resp:  [9-21] 18 (04/15 0700) BP: (81-132)/(25-75) 92/75 mmHg (04/15 0700) SpO2:  [90 %-100 %] 90 % (04/15 0700) Last BM Date: 10/14/14  Intake/Output from previous day: 04/14 0701 - 04/15 0700 In: 5480 [P.O.:480; I.V.:3900; IV Piggyback:1100] Out: 1495 [Urine:1345; Blood:150] Intake/Output this shift:    General: No acute distress but a bit agitated.  Lungs: Clear.  Sats are okay.  Abd: Soft, good bowel sounds.  Extremities: No changes  Neuro: Intact, agitated.  Lab Results: CBC   Recent Labs  10/15/14 1110 10/16/14 0332  WBC 8.9 9.1  HGB 12.9* 11.8*  HCT 38.0* 34.9*  PLT 199 195   BMET  Recent Labs  10/15/14 1110 10/16/14 0332  NA 141 137  K 3.7 3.5  CL 107 106  CO2 27 24  GLUCOSE 84 107*  BUN 8 6  CREATININE 1.09 0.95  CALCIUM 8.2* 8.2*   PT/INR  Recent Labs  10/15/14 1110  LABPROT 15.1  INR 1.18   ABG No results for input(s): PHART, HCO3 in the last 72 hours.  Invalid input(s): PCO2, PO2  Studies/Results: Dg Pelvis Portable  10/15/2014   CLINICAL DATA:  Pelvic fracture  EXAM: PORTABLE PELVIS 1-2 VIEWS  COMPARISON:  Radiography from 1 day prior  FINDINGS: Symphysis pubis diastasis at 15 mm. There is left sacroiliac diastasis. The right sacroiliac joint is also prominent, but not definitively widened currently. The diastasis is decreased from admission radiography.  A Foley catheter is present, with the balloon inflated in the partially opacified urinary bladder. There is no displacement of the bladder to suggest growth of the known pelvic hematoma. The hips are located and intact.  IMPRESSION: Pubic symphysis and sacroiliac diastasis is decreased from admission  radiography.   Electronically Signed   By: Marnee Spring M.D.   On: 10/15/2014 06:48   Dg Pelvis Comp Min 3v  10/15/2014   CLINICAL DATA:  Postop ORIF of pelvic ring fracture.  EXAM: JUDET PELVIS - 3+ VIEW  COMPARISON:  Radiographs earlier today.  CT 10/14/2014.  FINDINGS: Status post ORIF of the sacroiliac joints with a cannulated screw traversing both sacroiliac joints from left to right. Superior planus screws fixating the symphysis pubis are unchanged in position. There is no diastasis of the sacroiliac joints or symphysis pubis. There is no evidence of acute fracture. Nondisplaced left L5 transverse process fracture unchanged.  IMPRESSION: Intact hardware and stable alignment status post ORIF of the sacroiliac joints and symphysis pubis.   Electronically Signed   By: Carey Bullocks M.D.   On: 10/15/2014 21:14   Dg Pelvis 3v Judet  10/15/2014   CLINICAL DATA:  Pelvic ring fracture.  Hardware placement.  EXAM: DG C-ARM 61-120 MIN; JUDET PELVIS - 3+ VIEW  FLUOROSCOPY TIME:  Radiation Exposure Index (as provided by the fluoroscopic device):  If the device does not provide the exposure index:  Fluoroscopy Time (in minutes and seconds):  0 minutes 47 seconds  Number of Acquired Images:  13  COMPARISON:  10/15/2014  FINDINGS: Examination demonstrates a metallic plate and screws bridging the symphysis with hardware intact and anatomic alignment about the symphysis pubis joint. There is a screw extending from left to right bridging the sacroiliac joints. Remainder the  exam is unchanged.  IMPRESSION: Stabilization pelvic ring fractures with hardware intact.   Electronically Signed   By: Elberta Fortisaniel  Boyle M.D.   On: 10/15/2014 19:25   Dg Pelvis Comp Min 3v  10/15/2014   CLINICAL DATA:  Pelvic ring fracture.  EXAM: JUDET PELVIS - 3+ VIEW  COMPARISON:  Radiographs and CT scan dated 10/14/2014  FINDINGS: There is diastases of the pubic symphysis, 21 mm, not corrected for magnification. There is also abnormal widening  of the left sacroiliac joint.  There is a Foley catheter in the bladder with contrast in the bladder. No evidence of extravasation of contrast from the bladder.  There is a minimally displaced fracture of the left transverse process of L5.  IMPRESSION: Diastases of the pubic symphysis. Abnormal widening of the left sacroiliac joint. No contrast extravasation and bladder.   Electronically Signed   By: Francene BoyersJames  Maxwell M.D.   On: 10/15/2014 10:36   Dg C-arm 61-120 Min  10/15/2014   CLINICAL DATA:  Pelvic ring fracture.  Hardware placement.  EXAM: DG C-ARM 61-120 MIN; JUDET PELVIS - 3+ VIEW  FLUOROSCOPY TIME:  Radiation Exposure Index (as provided by the fluoroscopic device):  If the device does not provide the exposure index:  Fluoroscopy Time (in minutes and seconds):  0 minutes 47 seconds  Number of Acquired Images:  13  COMPARISON:  10/15/2014  FINDINGS: Examination demonstrates a metallic plate and screws bridging the symphysis with hardware intact and anatomic alignment about the symphysis pubis joint. There is a screw extending from left to right bridging the sacroiliac joints. Remainder the exam is unchanged.  IMPRESSION: Stabilization pelvic ring fractures with hardware intact.   Electronically Signed   By: Elberta Fortisaniel  Boyle M.D.   On: 10/15/2014 19:25    Anti-infectives: Anti-infectives    Start     Dose/Rate Route Frequency Ordered Stop   10/15/14 2200  ceFAZolin (ANCEF) IVPB 2 g/50 mL premix     2 g 100 mL/hr over 30 Minutes Intravenous 3 times per day 10/15/14 2036 10/16/14 2159   10/15/14 1533  ceFAZolin (ANCEF) 1-5 GM-% IVPB    Comments:  [name], [name]   : cabinet override      10/15/14 1533 10/16/14 0344   10/15/14 1533  ceFAZolin (ANCEF) 2-3 GM-% IVPB SOLR    Comments:  [name], [name]   : cabinet override      10/15/14 1533 10/16/14 0344   10/15/14 1400  [MAR Hold]  ceFAZolin (ANCEF) 3 g in dextrose 5 % 50 mL IVPB     (MAR Hold since 10/15/14 1355)   3 g 160 mL/hr over 30 Minutes Intravenous   Once 10/15/14 0850 10/15/14 1615      Assessment/Plan: s/p Procedure(s): OPEN REDUCTION INTERNAL FIXATION (ORIF) PELVIC FRACTURE SACRO-ILIAC PINNING LEFT SIDE Advance diet  Resume psychiatric medication.  LOS: 1 day   Marta LamasJames O. Gae BonWyatt, III, MD, FACS [phone] Trauma Surgeon 10/16/2014
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
## 2014-10-17 LAB — CBC WITH DIFFERENTIAL/PLATELET
Basophils Absolute: 0 10*3/uL (ref 0.0–0.1)
Basophils Relative: 0 % (ref 0–1)
EOS ABS: 0 10*3/uL (ref 0.0–0.7)
EOS PCT: 0 % (ref 0–5)
HCT: 31.8 % — ABNORMAL LOW (ref 39.0–52.0)
Hemoglobin: 11.1 g/dL — ABNORMAL LOW (ref 13.0–17.0)
LYMPHS ABS: 1.1 10*3/uL (ref 0.7–4.0)
LYMPHS PCT: 9 % — AB (ref 12–46)
MCH: 31.4 pg (ref 26.0–34.0)
MCHC: 34.9 g/dL (ref 30.0–36.0)
MCV: 89.8 fL (ref 78.0–100.0)
Monocytes Absolute: 0.8 10*3/uL (ref 0.1–1.0)
Monocytes Relative: 7 % (ref 3–12)
NEUTROS ABS: 9.6 10*3/uL — AB (ref 1.7–7.7)
Neutrophils Relative %: 84 % — ABNORMAL HIGH (ref 43–77)
Platelets: 176 10*3/uL (ref 150–400)
RBC: 3.54 MIL/uL — ABNORMAL LOW (ref 4.22–5.81)
RDW: 12.4 % (ref 11.5–15.5)
WBC: 11.5 10*3/uL — ABNORMAL HIGH (ref 4.0–10.5)

## 2014-10-17 LAB — PROTIME-INR
INR: 1.54 — ABNORMAL HIGH (ref 0.00–1.49)
Prothrombin Time: 18.7 seconds — ABNORMAL HIGH (ref 11.6–15.2)

## 2014-10-17 MED ORDER — WARFARIN SODIUM 7.5 MG PO TABS
7.5000 mg | ORAL_TABLET | Freq: Once | ORAL | Status: AC
  Administered 2014-10-17: 7.5 mg via ORAL
  Filled 2014-10-17: qty 1

## 2014-10-17 MED ORDER — SODIUM CHLORIDE 0.9 % IV BOLUS (SEPSIS)
500.0000 mL | Freq: Once | INTRAVENOUS | Status: AC
  Administered 2014-10-17: 500 mL via INTRAVENOUS

## 2014-10-17 NOTE — Progress Notes (Signed)
Bladder scan at 1620 which showed 260cc. Paged on-call MD and gave a telephone order to placed a foley catheter.

## 2014-10-17 NOTE — Progress Notes (Signed)
Patient still unable to void.  Bladder scan revealed 293 ml.  Intermittent urinary catheterization performed per protocol, obtained 300 ml of bloody urine.  On call for Trauma, Dr.Toth notified.  Orders to continue to monitor patient, if still unable to void follow protocol.
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
## 2014-10-17 NOTE — Progress Notes (Signed)
Called the on-call trauma MD about the pt not being able to void. Pt had been catheterized twice prior. Bladder scan @1300  showed 216cc. A telephone order was given to give a 500cc bolus and to bladder scan pt in 2hrs.

## 2014-10-17 NOTE — Progress Notes (Signed)
2 Days Post-Op  Subjective: Patient calm, sleepy; just woke up Pain adequately controlled Having some difficulty with voiding.  Had one I&O cath last night - wants to avoid another foley if possible  Objective: Vital signs in last 24 hours: Temp:  [98.2 F (36.8 C)-100 F (37.8 C)] 99.2 F (37.3 C) (04/16 0442) Pulse Rate:  [107-116] 107 (04/16 0442) Resp:  [18-33] 18 (04/16 0442) BP: (104-127)/(57-73) 120/73 mmHg (04/16 0442) SpO2:  [93 %-96 %] 96 % (04/16 0442) Last BM Date: 10/14/14  Intake/Output from previous day: 04/15 0701 - 04/16 0700 In: 1050 [I.V.:900] Out: 700 [Urine:700] Intake/Output this shift: Total I/O In: 360 [P.O.:360] Out: -   General appearance: alert, cooperative and no distress Resp: clear to auscultation bilaterally GI: soft, non-tender; bowel sounds normal; no masses,  no organomegaly Neurologic: Grossly normal  Lab Results:   Recent Labs  10/16/14 0332 10/17/14 0530  WBC 9.1 11.5*  HGB 11.8* 11.1*  HCT 34.9* 31.8*  PLT 195 176   BMET  Recent Labs  10/15/14 1110 10/16/14 0332  NA 141 137  K 3.7 3.5  CL 107 106  CO2 27 24  GLUCOSE 84 107*  BUN 8 6  CREATININE 1.09 0.95  CALCIUM 8.2* 8.2*   PT/INR  Recent Labs  10/15/14 1110 10/17/14 0530  LABPROT 15.1 18.7*  INR 1.18 1.54*   ABG No results for input(s): PHART, HCO3 in the last 72 hours.  Invalid input(s): PCO2, PO2  Studies/Results: Dg Pelvis Comp Min 3v  10/15/2014   CLINICAL DATA:  Postop ORIF of pelvic ring fracture.  EXAM: JUDET PELVIS - 3+ VIEW  COMPARISON:  Radiographs earlier today.  CT 10/14/2014.  FINDINGS: Status post ORIF of the sacroiliac joints with a cannulated screw traversing both sacroiliac joints from left to right. Superior planus screws fixating the symphysis pubis are unchanged in position. There is no diastasis of the sacroiliac joints or symphysis pubis. There is no evidence of acute fracture. Nondisplaced left L5 transverse process fracture  unchanged.  IMPRESSION: Intact hardware and stable alignment status post ORIF of the sacroiliac joints and symphysis pubis.   Electronically Signed   By: Carey Bullocks M.D.   On: 10/15/2014 21:14   Dg Pelvis 3v Judet  10/15/2014   CLINICAL DATA:  Pelvic ring fracture.  Hardware placement.  EXAM: DG C-ARM 61-120 MIN; JUDET PELVIS - 3+ VIEW  FLUOROSCOPY TIME:  Radiation Exposure Index (as provided by the fluoroscopic device):  If the device does not provide the exposure index:  Fluoroscopy Time (in minutes and seconds):  0 minutes 47 seconds  Number of Acquired Images:  13  COMPARISON:  10/15/2014  FINDINGS: Examination demonstrates a metallic plate and screws bridging the symphysis with hardware intact and anatomic alignment about the symphysis pubis joint. There is a screw extending from left to right bridging the sacroiliac joints. Remainder the exam is unchanged.  IMPRESSION: Stabilization pelvic ring fractures with hardware intact.   Electronically Signed   By: Elberta Fortis M.D.   On: 10/15/2014 19:25   Dg Pelvis Comp Min 3v  10/15/2014   CLINICAL DATA:  Pelvic ring fracture.  EXAM: JUDET PELVIS - 3+ VIEW  COMPARISON:  Radiographs and CT scan dated 10/14/2014  FINDINGS: There is diastases of the pubic symphysis, 21 mm, not corrected for magnification. There is also abnormal widening of the left sacroiliac joint.  There is a Foley catheter in the bladder with contrast in the bladder. No evidence of extravasation of contrast from  the bladder.  There is a minimally displaced fracture of the left transverse process of L5.  IMPRESSION: Diastases of the pubic symphysis. Abnormal widening of the left sacroiliac joint. No contrast extravasation and bladder.   Electronically Signed   By: Francene BoyersJames  Maxwell M.D.   On: 10/15/2014 10:36   Dg C-arm 61-120 Min  10/15/2014   CLINICAL DATA:  Pelvic ring fracture.  Hardware placement.  EXAM: DG C-ARM 61-120 MIN; JUDET PELVIS - 3+ VIEW  FLUOROSCOPY TIME:  Radiation  Exposure Index (as provided by the fluoroscopic device):  If the device does not provide the exposure index:  Fluoroscopy Time (in minutes and seconds):  0 minutes 47 seconds  Number of Acquired Images:  13  COMPARISON:  10/15/2014  FINDINGS: Examination demonstrates a metallic plate and screws bridging the symphysis with hardware intact and anatomic alignment about the symphysis pubis joint. There is a screw extending from left to right bridging the sacroiliac joints. Remainder the exam is unchanged.  IMPRESSION: Stabilization pelvic ring fractures with hardware intact.   Electronically Signed   By: Elberta Fortisaniel  Boyle M.D.   On: 10/15/2014 19:25    Anti-infectives: Anti-infectives    Start     Dose/Rate Route Frequency Ordered Stop   10/15/14 2200  ceFAZolin (ANCEF) IVPB 2 g/50 mL premix     2 g 100 mL/hr over 30 Minutes Intravenous 3 times per day 10/15/14 2036 10/16/14 1508   10/15/14 1533  ceFAZolin (ANCEF) 1-5 GM-% IVPB    Comments:  [name], [name]   : cabinet override      10/15/14 1533 10/16/14 0344   10/15/14 1533  ceFAZolin (ANCEF) 2-3 GM-% IVPB SOLR    Comments:  [name], [name]   : cabinet override      10/15/14 1533 10/16/14 0344   10/15/14 1400  [MAR Hold]  ceFAZolin (ANCEF) 3 g in dextrose 5 % 50 mL IVPB     (MAR Hold since 10/15/14 1355)   3 g 160 mL/hr over 30 Minutes Intravenous  Once 10/15/14 0850 10/15/14 1615      Assessment/Plan: s/p Procedure(s): OPEN REDUCTION INTERNAL FIXATION (ORIF) PELVIC FRACTURE (N/A) SACRO-ILIAC PINNING LEFT SIDE (Left) Advance diet  Home meds Physical therapy per Ortho  LOS: 2 days    Alianna Wurster K. 10/17/2014

## 2014-10-17 NOTE — Progress Notes (Signed)
PT Cancellation Note  Patient Details Name: Derrick Gutierrez MRN: [id number] DOB: 1981/05/23   Cancelled Treatment:    Reason Eval/Treat Not Completed: Pain limiting ability to participate;Fatigue/lethargy limiting ability to participate. Pt just returned to bed with RN assist upon arrival. Pt declining further activity due to pain and fatigue.   Ilda FoilGarrow, Davene Jobin Rene 10/17/2014, 2:07 PM

## 2014-10-17 NOTE — Progress Notes (Signed)
SPORTS MEDICINE AND JOINT REPLACEMENT  Georgena SpurlingStephen Lucey, MD   Altamese CabalMaurice Suleman Gunning, PA-C [address], [place], [place]  [id number]                             [phone]   PROGRESS NOTE  Subjective:  negative for Chest Pain  negative for Shortness of Breath  negative for Nausea/Vomiting   negative for Calf Pain  negative for Bowel Movement   Tolerating Diet: yes         Patient reports pain as 5 on 0-10 scale.    Objective: Vital signs in last 24 hours:   Patient Vitals for the past 24 hrs:  BP Temp Temp src Pulse Resp SpO2  10/17/14 0442 120/73 mmHg 99.2 F (37.3 C) Oral (!) 107 18 96 %  10/16/14 2022 105/66 mmHg 98.9 F (37.2 C) Oral (!) 115 18 96 %  10/16/14 1632 - 98.2 F (36.8 C) Oral - - -  10/16/14 1515 - - - (!) 111 (!) 27 95 %  10/16/14 1430 (!) 127/57 mmHg - - - (!) 33 -  10/16/14 1400 - - - (!) 113 (!) 25 96 %  10/16/14 1300 - - - (!) 111 (!) 21 95 %  10/16/14 1127 - 100 F (37.8 C) Oral - - -  10/16/14 1059 (!) 104/59 mmHg - - (!) 116 - 93 %    @flow {1959:LAST@   Intake/Output from previous day:   04/15 0701 - 04/16 0700 In: 1050 [I.V.:900] Out: 700 [Urine:700]   Intake/Output this shift:   04/16 0701 - 04/16 1900 In: 360 [P.O.:360] Out: -    Intake/Output      04/15 0701 - 04/16 0700 04/16 0701 - 04/17 0700   P.O.  360   I.V. (mL/kg) 900 (7.7)    Other 150    IV Piggyback     Total Intake(mL/kg) 1050 (9) 360 (3.1)   Urine (mL/kg/hr) 700 (0.3)    Blood     Total Output 700     Net +350 +360           LABORATORY DATA:  Recent Labs  10/15/14 1110 10/16/14 0332 10/17/14 0530  WBC 8.9 9.1 11.5*  HGB 12.9* 11.8* 11.1*  HCT 38.0* 34.9* 31.8*  PLT 199 195 176    Recent Labs  10/15/14 1110 10/16/14 0332  NA 141 137  K 3.7 3.5  CL 107 106  CO2 27 24  BUN 8 6  CREATININE 1.09 0.95  GLUCOSE 84 107*  CALCIUM 8.2* 8.2*   Lab Results  Component Value Date   INR 1.54* 10/17/2014   INR 1.18 10/15/2014    Examination:  General  appearance: alert, cooperative and no distress Extremities: extremities normal, atraumatic, no cyanosis or edema  Wound Exam: clean, dry, intact   Drainage:  None: wound tissue dry  Motor Exam: EHL and FHL Intact  Sensory Exam: Deep Peroneal normal   Assessment:    2 Days Post-Op  Procedure(s) (LRB): OPEN REDUCTION INTERNAL FIXATION (ORIF) PELVIC FRACTURE (N/A) SACRO-ILIAC PINNING LEFT SIDE (Left)  ADDITIONAL DIAGNOSIS:  Principal Problem:   Pelvic fracture     Plan: Physical Therapy as ordered Weight Bearing as Tolerated (WBAT) RLE, Touchdown Weight bearing LLE    DISCHARGE PLAN: per trauma          Octavian Godek 10/17/2014, 9:00 AM
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
## 2014-10-18 LAB — GLUCOSE, CAPILLARY: Glucose-Capillary: 133 mg/dL — ABNORMAL HIGH (ref 70–99)

## 2014-10-18 LAB — PROTIME-INR
INR: 2.45 — AB (ref 0.00–1.49)
PROTHROMBIN TIME: 26.8 s — AB (ref 11.6–15.2)

## 2014-10-18 MED ORDER — WARFARIN SODIUM 1 MG PO TABS
1.0000 mg | ORAL_TABLET | Freq: Once | ORAL | Status: AC
Start: 2014-10-18 — End: 2014-10-18
  Administered 2014-10-18: 1 mg via ORAL
  Filled 2014-10-18 (×2): qty 1

## 2014-10-18 MED ORDER — DOCUSATE SODIUM 100 MG PO CAPS
100.0000 mg | ORAL_CAPSULE | Freq: Two times a day (BID) | ORAL | Status: DC
  Administered 2014-10-18 – 2014-10-19 (×3): 100 mg via ORAL
  Filled 2014-10-18 (×3): qty 1

## 2014-10-18 MED ORDER — BISACODYL 10 MG RE SUPP
10.0000 mg | Freq: Every day | RECTAL | Status: DC | PRN
  Administered 2014-10-19: 10 mg via RECTAL
  Filled 2014-10-18: qty 1
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
## 2014-10-18 NOTE — Progress Notes (Signed)
3 Days Post-Op  Subjective: Patient sitting up in chair - having some pain right now Required replacement of Foley - unable to void on his own yesterday  Objective: Vital signs in last 24 hours: Temp:  [99.3 F (37.4 C)-99.6 F (37.6 C)] 99.3 F (37.4 C) (04/17 0502) Pulse Rate:  [98-108] 98 (04/17 0502) Resp:  [18] 18 (04/17 0502) BP: (113-129)/(49-76) 113/49 mmHg (04/17 0502) SpO2:  [97 %-99 %] 97 % (04/17 0502) Last BM Date: 10/14/14  Intake/Output from previous day: 04/16 0701 - 04/17 0700 In: 1080 [P.O.:1080] Out: 1250 [Urine:1250] Intake/Output this shift:    General appearance: alert, cooperative and no distress Resp: clear to auscultation bilaterally Cardio: regular rate and rhythm, S1, S2 normal, no murmur, click, rub or gallop GI: soft, non-tender; bowel sounds normal; no masses,  no organomegaly  Lab Results:   Recent Labs  10/16/14 0332 10/17/14 0530  WBC 9.1 11.5*  HGB 11.8* 11.1*  HCT 34.9* 31.8*  PLT 195 176   BMET  Recent Labs  10/15/14 1110 10/16/14 0332  NA 141 137  K 3.7 3.5  CL 107 106  CO2 27 24  GLUCOSE 84 107*  BUN 8 6  CREATININE 1.09 0.95  CALCIUM 8.2* 8.2*   PT/INR  Recent Labs  10/17/14 0530 10/18/14 0537  LABPROT 18.7* 26.8*  INR 1.54* 2.45*   ABG No results for input(s): PHART, HCO3 in the last 72 hours.  Invalid input(s): PCO2, PO2  Studies/Results: No results found.  Anti-infectives: Anti-infectives    Start     Dose/Rate Route Frequency Ordered Stop   10/15/14 2200  ceFAZolin (ANCEF) IVPB 2 g/50 mL premix     2 g 100 mL/hr over 30 Minutes Intravenous 3 times per day 10/15/14 2036 10/16/14 1508   10/15/14 1533  ceFAZolin (ANCEF) 1-5 GM-% IVPB    Comments:  [name], [name]   : cabinet override      10/15/14 1533 10/16/14 0344   10/15/14 1533  ceFAZolin (ANCEF) 2-3 GM-% IVPB SOLR    Comments:  [name], [name]   : cabinet override      10/15/14 1533 10/16/14 0344   10/15/14 1400  [MAR Hold]  ceFAZolin (ANCEF) 3 g  in dextrose 5 % 50 mL IVPB     (MAR Hold since 10/15/14 1355)   3 g 160 mL/hr over 30 Minutes Intravenous  Once 10/15/14 0850 10/15/14 1615      Assessment/Plan: s/p Procedure(s): OPEN REDUCTION INTERNAL FIXATION (ORIF) PELVIC FRACTURE (N/A) SACRO-ILIAC PINNING LEFT SIDE (Left) Foley in place for urinary retention  PT - will likely need SNF placement On home meds   LOS: 3 days    Derrick Gutierrez K. 10/18/2014
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
## 2014-10-19 ENCOUNTER — Encounter (HOSPITAL_COMMUNITY): Payer: Self-pay | Admitting: Orthopedic Surgery

## 2014-10-19 LAB — URINE MICROSCOPIC-ADD ON

## 2014-10-19 LAB — URINALYSIS, ROUTINE W REFLEX MICROSCOPIC
GLUCOSE, UA: NEGATIVE mg/dL
Ketones, ur: NEGATIVE mg/dL
LEUKOCYTES UA: NEGATIVE
Nitrite: NEGATIVE
Protein, ur: 30 mg/dL — AB
Specific Gravity, Urine: 1.025 (ref 1.005–1.030)
Urobilinogen, UA: >8 mg/dL — ABNORMAL HIGH (ref 0.0–1.0)
pH: 6.5 (ref 5.0–8.0)

## 2014-10-19 LAB — PROTIME-INR
INR: 1.89 — ABNORMAL HIGH (ref 0.00–1.49)
PROTHROMBIN TIME: 21.9 s — AB (ref 11.6–15.2)

## 2014-10-19 MED ORDER — WARFARIN SODIUM 7.5 MG PO TABS
7.5000 mg | ORAL_TABLET | Freq: Once | ORAL | Status: AC
  Administered 2014-10-19: 7.5 mg via ORAL
  Filled 2014-10-19: qty 1

## 2014-10-19 MED ORDER — OXYCODONE HCL 5 MG PO TABS
5.0000 mg | ORAL_TABLET | ORAL | Status: DC | PRN
  Administered 2014-10-19 – 2014-10-20 (×4): 15 mg via ORAL
  Filled 2014-10-19 (×4): qty 3

## 2014-10-19 MED ORDER — BETHANECHOL CHLORIDE 25 MG PO TABS
25.0000 mg | ORAL_TABLET | Freq: Four times a day (QID) | ORAL | Status: DC
  Administered 2014-10-19 – 2014-10-20 (×6): 25 mg via ORAL
  Filled 2014-10-19 (×6): qty 1

## 2014-10-19 MED ORDER — BACLOFEN 10 MG PO TABS: 10.0000 mg | ORAL_TABLET | Freq: Three times a day (TID) | ORAL | Status: DC | PRN

## 2014-10-19 MED ORDER — HYDROMORPHONE HCL 1 MG/ML IJ SOLN
0.5000 mg | INTRAMUSCULAR | Status: DC | PRN
  Administered 2014-10-19 (×3): 0.5 mg via INTRAVENOUS
  Filled 2014-10-19 (×3): qty 1

## 2014-10-19 MED ORDER — TAMSULOSIN HCL 0.4 MG PO CAPS
0.8000 mg | ORAL_CAPSULE | Freq: Every day | ORAL | Status: DC
  Administered 2014-10-19 – 2014-10-20 (×2): 0.8 mg via ORAL
  Filled 2014-10-19 (×2): qty 2

## 2014-10-19 NOTE — Discharge Instructions (Signed)
Information on my medicine - Coumadin®   (Warfarin) ° °This medication education was reviewed with me or my healthcare representative as part of my discharge preparation.   ° °Why was Coumadin prescribed for you? °Coumadin was prescribed for you because you have a blood clot or a medical condition that can cause an increased risk of forming blood clots. Blood clots can cause serious health problems by blocking the flow of blood to the heart, lung, or brain. Coumadin can prevent harmful blood clots from forming. °As a reminder your indication for Coumadin is:   Blood Clot Prevention After Orthopedic Surgery ° °What test will check on my response to Coumadin? °While on Coumadin (warfarin) you will need to have an INR test regularly to ensure that your dose is keeping you in the desired range. The INR (international normalized ratio) number is calculated from the result of the laboratory test called prothrombin time (PT). ° °If an INR APPOINTMENT HAS NOT ALREADY BEEN MADE FOR YOU please schedule an appointment to have this lab work done by your health care provider within 7 days. °Your INR goal is usually a number between:  2 to 3 or your provider may give you a more narrow range like 2-2.5.  Ask your health care provider during an office visit what your goal INR is. ° °What  do you need to  know  About  COUMADIN? °Take Coumadin (warfarin) exactly as prescribed by your healthcare provider about the same time each day.  DO NOT stop taking without talking to the doctor who prescribed the medication.  Stopping without other blood clot prevention medication to take the place of Coumadin may increase your risk of developing a new clot or stroke.  Get refills before you run out. ° °What do you do if you miss a dose? °If you miss a dose, take it as soon as you remember on the same day then continue your regularly scheduled regimen the next day.  Do not take two doses of Coumadin at the same time. ° °Important Safety  Information °A possible side effect of Coumadin (Warfarin) is an increased risk of bleeding. You should call your healthcare provider right away if you experience any of the following: °  Bleeding from an injury or your nose that does not stop. °  Unusual colored urine (red or dark brown) or unusual colored stools (red or black). °  Unusual bruising for unknown reasons. °  A serious fall or if you hit your head (even if there is no bleeding). ° °Some foods or medicines interact with Coumadin® (warfarin) and might alter your response to warfarin. To help avoid this: °  Eat a balanced diet, maintaining a consistent amount of Vitamin K. °  Notify your provider about major diet changes you plan to make. °  Avoid alcohol or limit your intake to 1 drink for women and 2 drinks for men per day. °(1 drink is 5 oz. wine, 12 oz. beer, or 1.5 oz. liquor.) ° °Make sure that ANY health care provider who prescribes medication for you knows that you are taking Coumadin (warfarin).  Also make sure the healthcare provider who is monitoring your Coumadin knows when you have started a new medication including herbals and non-prescription products. ° °Coumadin® (Warfarin)  Major Drug Interactions  °Increased Warfarin Effect Decreased Warfarin Effect  °Alcohol (large quantities) °Antibiotics (esp. Septra/Bactrim, Flagyl, Cipro) °Amiodarone (Cordarone) °Aspirin (ASA) °Cimetidine (Tagamet) °Megestrol (Megace) °NSAIDs (ibuprofen, naproxen, etc.) °Piroxicam (Feldene) °Propafenone (Rythmol SR) °Propranolol (  Inderal) °Isoniazid (INH) °Posaconazole (Noxafil) Barbiturates (Phenobarbital) °Carbamazepine (Tegretol) °Chlordiazepoxide (Librium) °Cholestyramine (Questran) °Griseofulvin °Oral Contraceptives °Rifampin °Sucralfate (Carafate) °Vitamin K  ° °Coumadin® (Warfarin) Major Herbal Interactions  °Increased Warfarin Effect Decreased Warfarin Effect  °Garlic °Ginseng °Ginkgo biloba Coenzyme Q10 °Green tea °St. John’s wort   ° °Coumadin® (Warfarin)  FOOD Interactions  °Eat a consistent number of servings per week of foods HIGH in Vitamin K °(1 serving = ½ cup)  °Collards (cooked, or boiled & drained) °Kale (cooked, or boiled & drained) °Mustard greens (cooked, or boiled & drained) °Parsley *serving size only = ¼ cup °Spinach (cooked, or boiled & drained) °Swiss chard (cooked, or boiled & drained) °Turnip greens (cooked, or boiled & drained)  °Eat a consistent number of servings per week of foods MEDIUM-HIGH in Vitamin K °(1 serving = 1 cup)  °Asparagus (cooked, or boiled & drained) °Broccoli (cooked, boiled & drained, or raw & chopped) °Brussel sprouts (cooked, or boiled & drained) *serving size only = ½ cup °Lettuce, raw (green leaf, endive, romaine) °Spinach, raw °Turnip greens, raw & chopped  ° °These websites have more information on Coumadin (warfarin):  www.coumadin.com; °www.ahrq.gov/consumer/coumadin.htm; ° ° ° °
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
## 2014-10-19 NOTE — Clinical Social Work Note (Signed)
CSW met with patient and wife at bedside. Patient and wife confirm that patient will need to DC to SNF once medically stable. CSW will start SNF search process. Patient and wife express interest in [facility] and [facility] for placement. Patient asks that treatment team communicate with his wife as he sometimes forgets to relay information to her due to his medications. CSW will follow up with available bed offers.    Liz Beach MSW, [place], [place], [phone]
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
## 2014-10-20 DIAGNOSIS — Z4789 Encounter for other orthopedic aftercare: Secondary | ICD-10-CM | POA: Diagnosis not present

## 2014-10-20 DIAGNOSIS — R102 Pelvic and perineal pain: Secondary | ICD-10-CM | POA: Diagnosis not present

## 2014-10-20 DIAGNOSIS — Z041 Encounter for examination and observation following transport accident: Secondary | ICD-10-CM | POA: Diagnosis not present

## 2014-10-20 DIAGNOSIS — D500 Iron deficiency anemia secondary to blood loss (chronic): Secondary | ICD-10-CM | POA: Diagnosis not present

## 2014-10-20 DIAGNOSIS — F418 Other specified anxiety disorders: Secondary | ICD-10-CM | POA: Diagnosis not present

## 2014-10-20 DIAGNOSIS — F339 Major depressive disorder, recurrent, unspecified: Secondary | ICD-10-CM | POA: Diagnosis not present

## 2014-10-20 DIAGNOSIS — S334XXD Traumatic rupture of symphysis pubis, subsequent encounter: Secondary | ICD-10-CM | POA: Diagnosis not present

## 2014-10-20 DIAGNOSIS — R262 Difficulty in walking, not elsewhere classified: Secondary | ICD-10-CM | POA: Diagnosis not present

## 2014-10-20 DIAGNOSIS — F259 Schizoaffective disorder, unspecified: Secondary | ICD-10-CM | POA: Diagnosis not present

## 2014-10-20 DIAGNOSIS — F419 Anxiety disorder, unspecified: Secondary | ICD-10-CM | POA: Diagnosis not present

## 2014-10-20 DIAGNOSIS — M6281 Muscle weakness (generalized): Secondary | ICD-10-CM | POA: Diagnosis not present

## 2014-10-20 DIAGNOSIS — S32811D Multiple fractures of pelvis with unstable disruption of pelvic ring, subsequent encounter for fracture with routine healing: Secondary | ICD-10-CM | POA: Diagnosis not present

## 2014-10-20 DIAGNOSIS — D62 Acute posthemorrhagic anemia: Secondary | ICD-10-CM | POA: Diagnosis not present

## 2014-10-20 DIAGNOSIS — G40909 Epilepsy, unspecified, not intractable, without status epilepticus: Secondary | ICD-10-CM | POA: Diagnosis not present

## 2014-10-20 DIAGNOSIS — S3282XD Multiple fractures of pelvis without disruption of pelvic ring, subsequent encounter for fracture with routine healing: Secondary | ICD-10-CM | POA: Diagnosis not present

## 2014-10-20 DIAGNOSIS — S329XXA Fracture of unspecified parts of lumbosacral spine and pelvis, initial encounter for closed fracture: Secondary | ICD-10-CM | POA: Diagnosis not present

## 2014-10-20 DIAGNOSIS — R339 Retention of urine, unspecified: Secondary | ICD-10-CM | POA: Diagnosis not present

## 2014-10-20 LAB — PROTIME-INR
INR: 1.72 — ABNORMAL HIGH (ref 0.00–1.49)
PROTHROMBIN TIME: 20.3 s — AB (ref 11.6–15.2)

## 2014-10-20 MED ORDER — WARFARIN SODIUM 5 MG PO TABS: 5.0000 mg | ORAL_TABLET | Freq: Once | ORAL | Status: DC

## 2014-10-20 MED ORDER — MAGNESIUM CITRATE PO SOLN
1.0000 | Freq: Once | ORAL | Status: AC
  Administered 2014-10-20: 1 via ORAL
  Filled 2014-10-20: qty 296

## 2014-10-20 MED ORDER — POLYETHYLENE GLYCOL 3350 17 G PO PACK
17.0000 g | PACK | Freq: Every day | ORAL | Status: DC
  Administered 2014-10-20: 17 g via ORAL
  Filled 2014-10-20: qty 1

## 2014-10-20 MED ORDER — BETHANECHOL CHLORIDE 25 MG PO TABS: 25.0000 mg | ORAL_TABLET | Freq: Four times a day (QID) | ORAL | Status: DC

## 2014-10-20 MED ORDER — WARFARIN SODIUM 5 MG PO TABS: ORAL_TABLET | ORAL | Status: DC

## 2014-10-20 MED ORDER — TAMSULOSIN HCL 0.4 MG PO CAPS: 0.8000 mg | ORAL_CAPSULE | Freq: Every day | ORAL | Status: DC

## 2014-10-20 MED ORDER — OXYCODONE HCL 5 MG PO TABS
10.0000 mg | ORAL_TABLET | ORAL | Status: DC | PRN
  Administered 2014-10-20: 15 mg via ORAL
  Filled 2014-10-20: qty 3

## 2014-10-20 MED ORDER — OXYCODONE-ACETAMINOPHEN 10-325 MG PO TABS: 1.0000 | ORAL_TABLET | ORAL | Status: DC | PRN

## 2014-10-20 MED ORDER — DOCUSATE SODIUM 100 MG PO CAPS: 200.0000 mg | ORAL_CAPSULE | Freq: Two times a day (BID) | ORAL | Status: DC

## 2014-10-20 MED ORDER — DOCUSATE SODIUM 100 MG PO CAPS
200.0000 mg | ORAL_CAPSULE | Freq: Two times a day (BID) | ORAL | Status: DC
  Administered 2014-10-20: 200 mg via ORAL
  Filled 2014-10-20: qty 2

## 2014-10-20 MED ORDER — POLYETHYLENE GLYCOL 3350 17 G PO PACK: 17.0000 g | PACK | Freq: Every day | ORAL | Status: DC

## 2014-10-20 MED ORDER — ENOXAPARIN SODIUM 150 MG/ML ~~LOC~~ SOLN: 0.5000 mg/kg | SUBCUTANEOUS | Status: DC
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
## 2014-10-20 NOTE — Clinical Social Work Note (Signed)
Clinical Social Worker met with patient and patient aunt at bedside to offer support and discuss available bed offers.  Patient with preference to [facility], however they are unable to work with patient liability insurance and [place] has no available male beds.  Patient and patient wife are agreeable with [facility] and discharge today.  CSW to facilitate patient discharge needs.  Barbette Or, [place]

## 2014-10-20 NOTE — Progress Notes (Signed)
Called report to the nurse Elnita Maxwellheryl at [facility] [place].

## 2014-10-20 NOTE — Clinical Social Work Placement (Signed)
Clinical Social Work Department CLINICAL SOCIAL WORK PLACEMENT NOTE 10/20/2014  Patient:  Barbette ReichmannURNAGE,Khali  Account Number:  [id number] Admit date:  10/15/2014  Clinical Social Worker:  Macario GoldsJESSE Lakeisa Heninger, LCSW  Date/time:  10/19/2014 03:30 PM  Clinical Social Work is seeking post-discharge placement for this patient at the following level of care:   SKILLED NURSING   (*CSW will update this form in Epic as items are completed)   10/19/2014  Patient/family provided with [facility] Department of Clinical Social Work's list of facilities offering this level of care within the geographic area requested by the patient (or if unable, by the patient's family).  10/19/2014  Patient/family informed of their freedom to choose among providers that offer the needed level of care, that participate in Medicare, Medicaid or managed care program needed by the patient, have an available bed and are willing to accept the patient.  10/19/2014  Patient/family informed of [facility]' ownership interest in [facility], as well as of the fact that they are under no obligation to receive care at this facility.  PASARR submitted to EDS on 10/19/2014 PASARR number received on 10/19/2014  FL2 transmitted to all facilities in geographic area requested by pt/family on  10/19/2014 FL2 transmitted to all facilities within larger geographic area on   Patient informed that his/her managed care company has contracts with or will negotiate with  certain facilities, including the following:     Patient/family informed of bed offers received:  10/20/2014 [facility] Physician recommends and patient chooses bed at    [facility] on  10/20/2014 Patient [facility] be [facility] Patient and family notified of transfer on 10/20/2014 Name of family member notified:  PATIENT WIFE OVER THE PHONE IN PATIENT  ROOM  The following physician request were entered in Epic:   Additional Comments:
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
## 2014-10-20 NOTE — Clinical Social Work Note (Signed)
Clinical Social Worker facilitated patient discharge including contacting patient family and facility to confirm patient discharge plans.  Clinical information faxed to facility and family agreeable with plan.  CSW arranged ambulance transport via [facility] to [facility].  RN to call report prior to discharge.  Clinical Social Worker will sign off for now as social work intervention is no longer needed. Please consult us again if new need arises.  Jesse Sincerity Cedar, LCSW [phone] 

## 2014-10-20 NOTE — Progress Notes (Signed)
UR completed.  Medicare IM (Important Message) delivered to patient today by me in anticipation of discharge.    Samiya Mervin, RN BSN [facility] CCM Trauma/Neuro ICU Case Manager [phone]  

## 2014-10-20 NOTE — Progress Notes (Signed)
PT Cancellation Note  Patient Details Name: Barbette Reichmannlliott Maston MRN: [id number] DOB: [birth year]   Cancelled Treatment:    Reason Eval/Treat Not Completed: Other (comment) Pt had just woken up and was eating lunch. Declined therapy this afternoon. Follow up in the AM.   Caroleen Hammanumley, [place], SPTA 10/20/2014, 3:12 PM

## 2014-10-20 NOTE — Progress Notes (Signed)
Patient ID: Derrick Gutierrez, male   DOB: 17-Apr-1981, 34 y.o.   MRN: [id number]   LOS: 5 days   Subjective: No new c/o except worried about his bowels moving.   Objective: Vital signs in last 24 hours: Temp:  [98.9 F (37.2 C)-100.1 F (37.8 C)] 98.9 F (37.2 C) (04/19 0554) Pulse Rate:  [89-100] 89 (04/19 0554) Resp:  [18] 18 (04/18 1320) BP: (130-144)/(64-75) 144/73 mmHg (04/19 0554) SpO2:  [99 %-100 %] 99 % (04/19 0554) Last BM Date: 10/14/14   Laboratory  Urinalysis    Component Value Date/Time   COLORURINE ORANGE* 10/19/2014 1852   APPEARANCEUR CLOUDY* 10/19/2014 1852   LABSPEC 1.025 10/19/2014 1852   PHURINE 6.5 10/19/2014 1852   GLUCOSEU NEGATIVE 10/19/2014 1852   HGBUR LARGE* 10/19/2014 1852   BILIRUBINUR SMALL* 10/19/2014 1852   KETONESUR NEGATIVE 10/19/2014 1852   PROTEINUR 30* 10/19/2014 1852   UROBILINOGEN >8.0* 10/19/2014 1852   NITRITE NEGATIVE 10/19/2014 1852   LEUKOCYTESUR NEGATIVE 10/19/2014 1852   Lab Results  Component Value Date   INR 1.72* 10/20/2014   INR 1.89* 10/19/2014   INR 2.45* 10/18/2014    Physical Exam General appearance: alert and no distress Resp: clear to auscultation bilaterally Cardio: regular rate and rhythm GI: normal findings: bowel sounds normal and soft, non-tender Extremities: NVI   Assessment/Plan: PHBC L5 TVP fx Pelvic fxs/diastasis s/p ORIF -- TDWB LLE ABL anemia -- Mild Urinary retention -- Urecholine, Flomax, voiding trial today FEN -- Increase OxyIR range, increase bowel regimen, give mag citrate VTE -- SCD's, Lovenox, coumadin Dispo -- SNF    Freeman CaldronMichael J. Rylynne Schicker, PA-C Pager: [phone] General Trauma PA Pager: [phone]  10/20/2014

## 2014-10-21 LAB — URINE CULTURE
COLONY COUNT: NO GROWTH
Culture: NO GROWTH

## 2014-10-23 DIAGNOSIS — Z041 Encounter for examination and observation following transport accident: Secondary | ICD-10-CM | POA: Diagnosis not present

## 2014-10-23 DIAGNOSIS — F418 Other specified anxiety disorders: Secondary | ICD-10-CM | POA: Diagnosis not present

## 2014-10-23 DIAGNOSIS — Z4789 Encounter for other orthopedic aftercare: Secondary | ICD-10-CM | POA: Diagnosis not present

## 2014-10-26 ENCOUNTER — Encounter (HOSPITAL_COMMUNITY): Payer: Self-pay | Admitting: Orthopedic Surgery

## 2014-10-28 DIAGNOSIS — S32811D Multiple fractures of pelvis with unstable disruption of pelvic ring, subsequent encounter for fracture with routine healing: Secondary | ICD-10-CM | POA: Diagnosis not present

## 2014-10-28 DIAGNOSIS — S334XXD Traumatic rupture of symphysis pubis, subsequent encounter: Secondary | ICD-10-CM | POA: Diagnosis not present

## 2014-11-18 DIAGNOSIS — S334XXD Traumatic rupture of symphysis pubis, subsequent encounter: Secondary | ICD-10-CM | POA: Diagnosis not present

## 2014-11-18 DIAGNOSIS — S32811D Multiple fractures of pelvis with unstable disruption of pelvic ring, subsequent encounter for fracture with routine healing: Secondary | ICD-10-CM | POA: Diagnosis not present
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
## 2014-12-16 DIAGNOSIS — S334XXD Traumatic rupture of symphysis pubis, subsequent encounter: Secondary | ICD-10-CM | POA: Diagnosis not present

## 2014-12-16 DIAGNOSIS — S32811D Multiple fractures of pelvis with unstable disruption of pelvic ring, subsequent encounter for fracture with routine healing: Secondary | ICD-10-CM | POA: Diagnosis not present

## 2014-12-23 DIAGNOSIS — F259 Schizoaffective disorder, unspecified: Secondary | ICD-10-CM | POA: Diagnosis not present

## 2015-01-01 DIAGNOSIS — M6281 Muscle weakness (generalized): Secondary | ICD-10-CM | POA: Diagnosis not present

## 2015-01-01 DIAGNOSIS — G40909 Epilepsy, unspecified, not intractable, without status epilepticus: Secondary | ICD-10-CM | POA: Diagnosis not present

## 2015-01-01 DIAGNOSIS — D500 Iron deficiency anemia secondary to blood loss (chronic): Secondary | ICD-10-CM | POA: Diagnosis not present

## 2015-01-01 DIAGNOSIS — S3282XD Multiple fractures of pelvis without disruption of pelvic ring, subsequent encounter for fracture with routine healing: Secondary | ICD-10-CM | POA: Diagnosis not present

## 2015-01-01 DIAGNOSIS — R262 Difficulty in walking, not elsewhere classified: Secondary | ICD-10-CM | POA: Diagnosis not present

## 2015-01-01 DIAGNOSIS — F339 Major depressive disorder, recurrent, unspecified: Secondary | ICD-10-CM | POA: Diagnosis not present

## 2015-01-01 DIAGNOSIS — F419 Anxiety disorder, unspecified: Secondary | ICD-10-CM | POA: Diagnosis not present

## 2015-01-01 DIAGNOSIS — R339 Retention of urine, unspecified: Secondary | ICD-10-CM | POA: Diagnosis not present

## 2015-01-05 ENCOUNTER — Telehealth: Payer: Self-pay | Admitting: Family Medicine

## 2015-01-05 DIAGNOSIS — F419 Anxiety disorder, unspecified: Secondary | ICD-10-CM | POA: Diagnosis not present

## 2015-01-05 DIAGNOSIS — G40909 Epilepsy, unspecified, not intractable, without status epilepticus: Secondary | ICD-10-CM | POA: Diagnosis not present

## 2015-01-05 DIAGNOSIS — M6281 Muscle weakness (generalized): Secondary | ICD-10-CM | POA: Diagnosis not present

## 2015-01-05 DIAGNOSIS — D500 Iron deficiency anemia secondary to blood loss (chronic): Secondary | ICD-10-CM | POA: Diagnosis not present

## 2015-01-05 DIAGNOSIS — S3282XD Multiple fractures of pelvis without disruption of pelvic ring, subsequent encounter for fracture with routine healing: Secondary | ICD-10-CM | POA: Diagnosis not present

## 2015-01-05 DIAGNOSIS — F329 Major depressive disorder, single episode, unspecified: Secondary | ICD-10-CM | POA: Diagnosis not present

## 2015-01-05 NOTE — Telephone Encounter (Signed)
Derrick Gutierrez, physical threapist needs a verbas to continue PT twice weekly for 4 weeks and also for a nurse to come out to check pt's PT/INR.  Please call [phone]

## 2015-01-06 NOTE — Telephone Encounter (Signed)
Gave verbal for PT and for PT[facility]INR Lab, [facility]

## 2015-01-07 DIAGNOSIS — D500 Iron deficiency anemia secondary to blood loss (chronic): Secondary | ICD-10-CM | POA: Diagnosis not present

## 2015-01-07 DIAGNOSIS — F329 Major depressive disorder, single episode, unspecified: Secondary | ICD-10-CM | POA: Diagnosis not present

## 2015-01-07 DIAGNOSIS — M6281 Muscle weakness (generalized): Secondary | ICD-10-CM | POA: Diagnosis not present

## 2015-01-07 DIAGNOSIS — S3282XD Multiple fractures of pelvis without disruption of pelvic ring, subsequent encounter for fracture with routine healing: Secondary | ICD-10-CM | POA: Diagnosis not present

## 2015-01-07 DIAGNOSIS — G40909 Epilepsy, unspecified, not intractable, without status epilepticus: Secondary | ICD-10-CM | POA: Diagnosis not present

## 2015-01-07 DIAGNOSIS — F419 Anxiety disorder, unspecified: Secondary | ICD-10-CM | POA: Diagnosis not present

## 2015-01-07 DIAGNOSIS — Z7901 Long term (current) use of anticoagulants: Secondary | ICD-10-CM | POA: Diagnosis not present

## 2015-01-08 ENCOUNTER — Telehealth: Payer: Self-pay | Admitting: Family Medicine

## 2015-01-08 NOTE — Telephone Encounter (Signed)
PLEASE CALL THIS PERSON IF THIS NEEDS DONE BEFORE WEEKEND. [facility]

## 2015-01-08 NOTE — Telephone Encounter (Signed)
Velda Shellrystal Johnson, Director of Home Health  Medication called saying she received the PT-INR on Mr. Derrick Gutierrez. His PT is: 20.7 and his INR is: 1.78. She would like a phone call regarding what the next step in his care should be. Crystal's ph# [phone] Thank you.

## 2015-01-08 NOTE — Telephone Encounter (Signed)
Attempted to call home health. Received answering service. I am not sure who is ordering PT INR on this patient. He is not on blood thinners to my knowledge and it doesn't look like anyone from this office ordered it. Since unable to reach home health RN to clarify, may have to be reviewed on Monday by Dr. Juanetta GoslingHawkins.

## 2015-01-11 ENCOUNTER — Ambulatory Visit (INDEPENDENT_AMBULATORY_CARE_PROVIDER_SITE_OTHER): Payer: Medicare Other | Admitting: Family Medicine

## 2015-01-11 ENCOUNTER — Encounter: Payer: Self-pay | Admitting: Family Medicine

## 2015-01-11 VITALS — BP 103/67 | HR 89 | Resp 16 | Ht 69.0 in | Wt 262.0 lb

## 2015-01-11 DIAGNOSIS — I82409 Acute embolism and thrombosis of unspecified deep veins of unspecified lower extremity: Secondary | ICD-10-CM

## 2015-01-11 DIAGNOSIS — F329 Major depressive disorder, single episode, unspecified: Secondary | ICD-10-CM

## 2015-01-11 DIAGNOSIS — F32A Depression, unspecified: Secondary | ICD-10-CM
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
## 2015-01-11 NOTE — Telephone Encounter (Signed)
Will discuss with him at office visit today.-jh

## 2015-01-12 DIAGNOSIS — I82409 Acute embolism and thrombosis of unspecified deep veins of unspecified lower extremity: Secondary | ICD-10-CM | POA: Diagnosis not present

## 2015-01-13 DIAGNOSIS — S334XXD Traumatic rupture of symphysis pubis, subsequent encounter: Secondary | ICD-10-CM | POA: Diagnosis not present

## 2015-01-13 DIAGNOSIS — G40909 Epilepsy, unspecified, not intractable, without status epilepticus: Secondary | ICD-10-CM | POA: Diagnosis not present

## 2015-01-13 DIAGNOSIS — D500 Iron deficiency anemia secondary to blood loss (chronic): Secondary | ICD-10-CM | POA: Diagnosis not present

## 2015-01-13 DIAGNOSIS — F329 Major depressive disorder, single episode, unspecified: Secondary | ICD-10-CM | POA: Diagnosis not present

## 2015-01-13 DIAGNOSIS — F419 Anxiety disorder, unspecified: Secondary | ICD-10-CM | POA: Diagnosis not present

## 2015-01-13 DIAGNOSIS — S32811D Multiple fractures of pelvis with unstable disruption of pelvic ring, subsequent encounter for fracture with routine healing: Secondary | ICD-10-CM | POA: Diagnosis not present

## 2015-01-13 DIAGNOSIS — M6281 Muscle weakness (generalized): Secondary | ICD-10-CM | POA: Diagnosis not present

## 2015-01-13 DIAGNOSIS — S3282XD Multiple fractures of pelvis without disruption of pelvic ring, subsequent encounter for fracture with routine healing: Secondary | ICD-10-CM | POA: Diagnosis not present

## 2015-01-13 NOTE — Telephone Encounter (Signed)
He was seen ion office on 7/11 and he was given an [facility]  I have not received any results yet.  Has he gone to get labwork done yet.-jh

## 2015-01-13 NOTE — Telephone Encounter (Signed)
Derrick MccreedyBarbara from home health medicine manager called regarding pt needs to find out when is the pt is having follow up lab work done the last one was from 01/07/2015 and current dosage of warfarin 10 and 1 mg total 11 mg recorded her number is [phone].

## 2015-01-14 ENCOUNTER — Telehealth: Payer: Self-pay

## 2015-01-14 DIAGNOSIS — F329 Major depressive disorder, single episode, unspecified: Secondary | ICD-10-CM | POA: Diagnosis not present

## 2015-01-14 DIAGNOSIS — D500 Iron deficiency anemia secondary to blood loss (chronic): Secondary | ICD-10-CM | POA: Diagnosis not present

## 2015-01-14 DIAGNOSIS — F419 Anxiety disorder, unspecified: Secondary | ICD-10-CM | POA: Diagnosis not present

## 2015-01-14 DIAGNOSIS — G40909 Epilepsy, unspecified, not intractable, without status epilepticus: Secondary | ICD-10-CM | POA: Diagnosis not present

## 2015-01-14 DIAGNOSIS — M6281 Muscle weakness (generalized): Secondary | ICD-10-CM | POA: Diagnosis not present

## 2015-01-14 DIAGNOSIS — S3282XD Multiple fractures of pelvis without disruption of pelvic ring, subsequent encounter for fracture with routine healing: Secondary | ICD-10-CM | POA: Diagnosis not present

## 2015-01-14 LAB — PROTIME-INR
INR: 1.2 (ref 0.8–1.2)
Prothrombin Time: 12.6 s — ABNORMAL HIGH (ref 9.1–12.0)
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
## 2015-01-14 NOTE — Telephone Encounter (Signed)
Derrick Dungdvised Derrick Gutierrez that patient has gone and done the lab work and also has Orthopedic Surgeon that has D/C his Warfarin.[facility]

## 2015-01-15 DIAGNOSIS — G40909 Epilepsy, unspecified, not intractable, without status epilepticus: Secondary | ICD-10-CM | POA: Diagnosis not present

## 2015-01-15 DIAGNOSIS — F419 Anxiety disorder, unspecified: Secondary | ICD-10-CM | POA: Diagnosis not present

## 2015-01-15 DIAGNOSIS — M6281 Muscle weakness (generalized): Secondary | ICD-10-CM | POA: Diagnosis not present

## 2015-01-15 DIAGNOSIS — S3282XD Multiple fractures of pelvis without disruption of pelvic ring, subsequent encounter for fracture with routine healing: Secondary | ICD-10-CM | POA: Diagnosis not present

## 2015-01-15 DIAGNOSIS — F329 Major depressive disorder, single episode, unspecified: Secondary | ICD-10-CM | POA: Diagnosis not present

## 2015-01-15 DIAGNOSIS — D500 Iron deficiency anemia secondary to blood loss (chronic): Secondary | ICD-10-CM | POA: Diagnosis not present

## 2015-01-18 DIAGNOSIS — G40909 Epilepsy, unspecified, not intractable, without status epilepticus: Secondary | ICD-10-CM | POA: Diagnosis not present

## 2015-01-18 DIAGNOSIS — F331 Major depressive disorder, recurrent, moderate: Secondary | ICD-10-CM | POA: Diagnosis not present

## 2015-01-18 DIAGNOSIS — F329 Major depressive disorder, single episode, unspecified: Secondary | ICD-10-CM | POA: Diagnosis not present

## 2015-01-18 DIAGNOSIS — S3282XD Multiple fractures of pelvis without disruption of pelvic ring, subsequent encounter for fracture with routine healing: Secondary | ICD-10-CM | POA: Diagnosis not present

## 2015-01-18 DIAGNOSIS — F419 Anxiety disorder, unspecified: Secondary | ICD-10-CM | POA: Diagnosis not present

## 2015-01-18 DIAGNOSIS — D500 Iron deficiency anemia secondary to blood loss (chronic): Secondary | ICD-10-CM | POA: Diagnosis not present

## 2015-01-18 DIAGNOSIS — M6281 Muscle weakness (generalized): Secondary | ICD-10-CM | POA: Diagnosis not present

## 2015-01-21 ENCOUNTER — Telehealth: Payer: Self-pay

## 2015-01-21 NOTE — Telephone Encounter (Signed)
Patient left message that he needs refills and I called to see what Rx. I am working on Psych ref. I need to see if he has heard from anyone.[facility]

## 2015-01-26 DIAGNOSIS — M25551 Pain in right hip: Secondary | ICD-10-CM | POA: Diagnosis not present

## 2015-01-26 DIAGNOSIS — S32810D Multiple fractures of pelvis with stable disruption of pelvic ring, subsequent encounter for fracture with routine healing: Secondary | ICD-10-CM | POA: Diagnosis not present

## 2015-01-26 DIAGNOSIS — M6281 Muscle weakness (generalized): Secondary | ICD-10-CM | POA: Diagnosis not present

## 2015-01-26 DIAGNOSIS — M25552 Pain in left hip: Secondary | ICD-10-CM | POA: Diagnosis not present

## 2015-01-28 DIAGNOSIS — F331 Major depressive disorder, recurrent, moderate: Secondary | ICD-10-CM | POA: Diagnosis not present

## 2015-01-29 DIAGNOSIS — M25552 Pain in left hip: Secondary | ICD-10-CM | POA: Diagnosis not present

## 2015-01-29 DIAGNOSIS — M25551 Pain in right hip: Secondary | ICD-10-CM | POA: Diagnosis not present

## 2015-01-29 DIAGNOSIS — S32810D Multiple fractures of pelvis with stable disruption of pelvic ring, subsequent encounter for fracture with routine healing: Secondary | ICD-10-CM | POA: Diagnosis not present

## 2015-01-29 DIAGNOSIS — M6281 Muscle weakness (generalized): Secondary | ICD-10-CM | POA: Diagnosis not present

## 2015-02-08 ENCOUNTER — Ambulatory Visit (INDEPENDENT_AMBULATORY_CARE_PROVIDER_SITE_OTHER): Payer: Medicare Other | Admitting: Family Medicine

## 2015-02-08 ENCOUNTER — Encounter: Payer: Self-pay | Admitting: Family Medicine

## 2015-02-08 VITALS — BP 124/79 | HR 75 | Temp 98.6°F | Resp 16 | Ht 69.0 in | Wt 255.0 lb

## 2015-02-08 DIAGNOSIS — M62838 Other muscle spasm: Secondary | ICD-10-CM | POA: Diagnosis not present

## 2015-02-08 DIAGNOSIS — F900 Attention-deficit hyperactivity disorder, predominantly inattentive type: Secondary | ICD-10-CM | POA: Insufficient documentation

## 2015-02-08 DIAGNOSIS — F909 Attention-deficit hyperactivity disorder, unspecified type: Secondary | ICD-10-CM

## 2015-02-08 MED ORDER — BACLOFEN 10 MG PO TABS: 10.0000 mg | ORAL_TABLET | Freq: Three times a day (TID) | ORAL | Status: DC | PRN

## 2015-02-08 MED ORDER — BACLOFEN 10 MG PO TABS
10.0000 mg | ORAL_TABLET | Freq: Three times a day (TID) | ORAL | Status: DC | PRN
Start: 2015-02-08 — End: 2015-02-08

## 2015-02-08 NOTE — Patient Instructions (Signed)
Continue meds from Mental Health.  Continue PT.
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
## 2015-02-09 DIAGNOSIS — M25551 Pain in right hip: Secondary | ICD-10-CM | POA: Diagnosis not present

## 2015-02-09 DIAGNOSIS — S32810D Multiple fractures of pelvis with stable disruption of pelvic ring, subsequent encounter for fracture with routine healing: Secondary | ICD-10-CM | POA: Diagnosis not present

## 2015-02-09 DIAGNOSIS — M6281 Muscle weakness (generalized): Secondary | ICD-10-CM | POA: Diagnosis not present

## 2015-02-09 DIAGNOSIS — M25552 Pain in left hip: Secondary | ICD-10-CM | POA: Diagnosis not present

## 2015-02-12 ENCOUNTER — Ambulatory Visit: Payer: Medicaid Other | Admitting: Family Medicine

## 2015-02-16 DIAGNOSIS — M6281 Muscle weakness (generalized): Secondary | ICD-10-CM | POA: Diagnosis not present

## 2015-02-16 DIAGNOSIS — M25552 Pain in left hip: Secondary | ICD-10-CM | POA: Diagnosis not present

## 2015-02-16 DIAGNOSIS — S32810D Multiple fractures of pelvis with stable disruption of pelvic ring, subsequent encounter for fracture with routine healing: Secondary | ICD-10-CM | POA: Diagnosis not present

## 2015-02-16 DIAGNOSIS — M25551 Pain in right hip: Secondary | ICD-10-CM | POA: Diagnosis not present

## 2015-02-17 DIAGNOSIS — S32811D Multiple fractures of pelvis with unstable disruption of pelvic ring, subsequent encounter for fracture with routine healing: Secondary | ICD-10-CM | POA: Diagnosis not present

## 2015-02-17 DIAGNOSIS — S334XXD Traumatic rupture of symphysis pubis, subsequent encounter: Secondary | ICD-10-CM | POA: Diagnosis not present

## 2015-02-18 DIAGNOSIS — F331 Major depressive disorder, recurrent, moderate: Secondary | ICD-10-CM | POA: Diagnosis not present

## 2015-02-18 DIAGNOSIS — S32810D Multiple fractures of pelvis with stable disruption of pelvic ring, subsequent encounter for fracture with routine healing: Secondary | ICD-10-CM | POA: Diagnosis not present

## 2015-02-22 DIAGNOSIS — S32810D Multiple fractures of pelvis with stable disruption of pelvic ring, subsequent encounter for fracture with routine healing: Secondary | ICD-10-CM | POA: Diagnosis not present

## 2015-02-22 DIAGNOSIS — L03032 Cellulitis of left toe: Secondary | ICD-10-CM | POA: Diagnosis not present

## 2015-02-22 DIAGNOSIS — M25551 Pain in right hip: Secondary | ICD-10-CM | POA: Diagnosis not present

## 2015-02-22 DIAGNOSIS — M6281 Muscle weakness (generalized): Secondary | ICD-10-CM | POA: Diagnosis not present

## 2015-02-22 DIAGNOSIS — L600 Ingrowing nail: Secondary | ICD-10-CM | POA: Diagnosis not present

## 2015-02-22 DIAGNOSIS — M25552 Pain in left hip: Secondary | ICD-10-CM | POA: Diagnosis not present

## 2015-03-01 DIAGNOSIS — M25552 Pain in left hip: Secondary | ICD-10-CM | POA: Diagnosis not present

## 2015-03-01 DIAGNOSIS — M6281 Muscle weakness (generalized): Secondary | ICD-10-CM | POA: Diagnosis not present

## 2015-03-01 DIAGNOSIS — M25551 Pain in right hip: Secondary | ICD-10-CM | POA: Diagnosis not present

## 2015-03-01 DIAGNOSIS — S32810D Multiple fractures of pelvis with stable disruption of pelvic ring, subsequent encounter for fracture with routine healing: Secondary | ICD-10-CM | POA: Diagnosis not present

## 2015-03-02 ENCOUNTER — Telehealth: Payer: Self-pay | Admitting: Family Medicine

## 2015-03-02 NOTE — Telephone Encounter (Signed)
Pt saw Dr. Jenne Campus 06/2014. I called ENT and gave pts Medicaid #. Called patient back and told him he should be able schedule his f/u now.

## 2015-03-02 NOTE — Telephone Encounter (Signed)
Pt would like to have an appt scheduled with ENT about his breathing issue.  Please call [phone] or [phone]

## 2015-03-16 ENCOUNTER — Telehealth: Payer: Self-pay | Admitting: Family Medicine

## 2015-03-16 NOTE — Telephone Encounter (Signed)
Pt given contact info to Dr. Adriana Simas and Cherylann Ratel. Nothing further needed.

## 2015-03-16 NOTE — Telephone Encounter (Signed)
Pt said he was referred to a therapist in [place] previously and wanted the phone number and location again.  Please call [phone]

## 2015-03-18 DIAGNOSIS — R0683 Snoring: Secondary | ICD-10-CM | POA: Diagnosis not present

## 2015-03-18 DIAGNOSIS — J309 Allergic rhinitis, unspecified: Secondary | ICD-10-CM | POA: Diagnosis not present

## 2015-03-18 DIAGNOSIS — J342 Deviated nasal septum: Secondary | ICD-10-CM | POA: Diagnosis not present

## 2015-03-22 DIAGNOSIS — M6281 Muscle weakness (generalized): Secondary | ICD-10-CM | POA: Diagnosis not present

## 2015-03-22 DIAGNOSIS — S32810D Multiple fractures of pelvis with stable disruption of pelvic ring, subsequent encounter for fracture with routine healing: Secondary | ICD-10-CM | POA: Diagnosis not present

## 2015-03-22 DIAGNOSIS — M25551 Pain in right hip: Secondary | ICD-10-CM | POA: Diagnosis not present

## 2015-03-22 DIAGNOSIS — M25552 Pain in left hip: Secondary | ICD-10-CM | POA: Diagnosis not present

## 2015-04-07 ENCOUNTER — Ambulatory Visit: Payer: Medicare Other | Admitting: Family Medicine

## 2015-04-08 DIAGNOSIS — M25551 Pain in right hip: Secondary | ICD-10-CM | POA: Diagnosis not present

## 2015-04-08 DIAGNOSIS — S32810D Multiple fractures of pelvis with stable disruption of pelvic ring, subsequent encounter for fracture with routine healing: Secondary | ICD-10-CM | POA: Diagnosis not present

## 2015-04-08 DIAGNOSIS — M25552 Pain in left hip: Secondary | ICD-10-CM | POA: Diagnosis not present

## 2015-04-08 DIAGNOSIS — M6281 Muscle weakness (generalized): Secondary | ICD-10-CM | POA: Diagnosis not present

## 2015-04-12 ENCOUNTER — Ambulatory Visit: Payer: Medicare Other | Admitting: Family Medicine

## 2015-04-12 ENCOUNTER — Ambulatory Visit (INDEPENDENT_AMBULATORY_CARE_PROVIDER_SITE_OTHER): Payer: Medicare Other | Admitting: Family Medicine

## 2015-04-12 ENCOUNTER — Encounter: Payer: Self-pay | Admitting: Family Medicine

## 2015-04-12 VITALS — BP 128/84 | HR 100 | Temp 98.3°F | Resp 16 | Ht 69.0 in | Wt 250.6 lb

## 2015-04-12 DIAGNOSIS — Z23 Encounter for immunization: Secondary | ICD-10-CM | POA: Diagnosis not present

## 2015-04-12 DIAGNOSIS — G8929 Other chronic pain: Secondary | ICD-10-CM | POA: Insufficient documentation

## 2015-04-12 DIAGNOSIS — M62838 Other muscle spasm: Secondary | ICD-10-CM

## 2015-04-12 DIAGNOSIS — F5232 Male orgasmic disorder: Secondary | ICD-10-CM | POA: Insufficient documentation

## 2015-04-12 DIAGNOSIS — R000 Tachycardia, unspecified: Secondary | ICD-10-CM

## 2015-04-12 DIAGNOSIS — M549 Dorsalgia, unspecified: Secondary | ICD-10-CM

## 2015-04-12 DIAGNOSIS — J4520 Mild intermittent asthma, uncomplicated: Secondary | ICD-10-CM | POA: Insufficient documentation

## 2015-04-12 MED ORDER — BACLOFEN 10 MG PO TABS: 10.0000 mg | ORAL_TABLET | Freq: Three times a day (TID) | ORAL | Status: DC
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
## 2015-04-13 ENCOUNTER — Ambulatory Visit: Payer: Medicare Other | Admitting: Family Medicine

## 2015-04-14 ENCOUNTER — Telehealth: Payer: Self-pay | Admitting: Family Medicine

## 2015-04-14 DIAGNOSIS — F331 Major depressive disorder, recurrent, moderate: Secondary | ICD-10-CM | POA: Diagnosis not present

## 2015-04-14 NOTE — Telephone Encounter (Signed)
Pt. caleed states he need  Office notes, demographics , fax to  Dr. Lucianne MussLima  [phone], Dr. Isidore Moosffice  # is  [phone],  Pt.call back # is  [phone]

## 2015-04-14 NOTE — Telephone Encounter (Signed)
done

## 2015-04-15 ENCOUNTER — Ambulatory Visit: Payer: Medicare Other | Admitting: Family Medicine

## 2015-04-30 ENCOUNTER — Other Ambulatory Visit: Payer: Self-pay

## 2015-04-30 ENCOUNTER — Emergency Department
Admission: EM | Admit: 2015-04-30 | Discharge: 2015-05-01 | Disposition: A | Discharge status: Other Acute Inpatient Hospital | Payer: Medicare Other | Attending: Emergency Medicine | Admitting: Emergency Medicine

## 2015-04-30 ENCOUNTER — Encounter: Payer: Self-pay | Admitting: *Deleted

## 2015-04-30 DIAGNOSIS — R531 Weakness: Secondary | ICD-10-CM | POA: Diagnosis not present

## 2015-04-30 DIAGNOSIS — F339 Major depressive disorder, recurrent, unspecified: Secondary | ICD-10-CM | POA: Insufficient documentation

## 2015-04-30 DIAGNOSIS — F329 Major depressive disorder, single episode, unspecified: Secondary | ICD-10-CM

## 2015-04-30 DIAGNOSIS — F6389 Other impulse disorders: Secondary | ICD-10-CM | POA: Diagnosis not present

## 2015-04-30 DIAGNOSIS — Z79899 Other long term (current) drug therapy: Secondary | ICD-10-CM | POA: Diagnosis not present

## 2015-04-30 DIAGNOSIS — F419 Anxiety disorder, unspecified: Secondary | ICD-10-CM | POA: Insufficient documentation

## 2015-04-30 DIAGNOSIS — Z791 Long term (current) use of non-steroidal anti-inflammatories (NSAID): Secondary | ICD-10-CM | POA: Insufficient documentation

## 2015-04-30 DIAGNOSIS — F3341 Major depressive disorder, recurrent, in partial remission: Secondary | ICD-10-CM

## 2015-04-30 DIAGNOSIS — R110 Nausea: Secondary | ICD-10-CM | POA: Diagnosis not present

## 2015-04-30 DIAGNOSIS — F909 Attention-deficit hyperactivity disorder, unspecified type: Secondary | ICD-10-CM

## 2015-04-30 DIAGNOSIS — R410 Disorientation, unspecified: Secondary | ICD-10-CM | POA: Diagnosis not present

## 2015-04-30 DIAGNOSIS — R55 Syncope and collapse: Secondary | ICD-10-CM | POA: Diagnosis not present

## 2015-04-30 DIAGNOSIS — F900 Attention-deficit hyperactivity disorder, predominantly inattentive type: Secondary | ICD-10-CM | POA: Diagnosis present

## 2015-04-30 DIAGNOSIS — R000 Tachycardia, unspecified: Secondary | ICD-10-CM | POA: Diagnosis not present

## 2015-04-30 DIAGNOSIS — F32A Depression, unspecified: Secondary | ICD-10-CM

## 2015-04-30 LAB — COMPREHENSIVE METABOLIC PANEL
ALT: 10 U/L — ABNORMAL LOW (ref 17–63)
ANION GAP: 8 (ref 5–15)
AST: 18 U/L (ref 15–41)
Albumin: 4.1 g/dL (ref 3.5–5.0)
Alkaline Phosphatase: 99 U/L (ref 38–126)
BILIRUBIN TOTAL: 0.7 mg/dL (ref 0.3–1.2)
BUN: 12 mg/dL (ref 6–20)
CALCIUM: 9.5 mg/dL (ref 8.9–10.3)
CO2: 26 mmol/L (ref 22–32)
Chloride: 104 mmol/L (ref 101–111)
Creatinine, Ser: 0.93 mg/dL (ref 0.61–1.24)
GFR calc non Af Amer: >60 mL/min (ref >60)
Glucose, Bld: 88 mg/dL (ref 65–99)
Potassium: 3.7 mmol/L (ref 3.5–5.1)
Sodium: 138 mmol/L (ref 135–145)
TOTAL PROTEIN: 8 g/dL (ref 6.5–8.1)

## 2015-04-30 LAB — ACETAMINOPHEN LEVEL: Acetaminophen (Tylenol), Serum: <10 ug/mL — ABNORMAL LOW (ref 10–30)

## 2015-04-30 LAB — CBC WITH DIFFERENTIAL/PLATELET
BASOS ABS: 0.1 10*3/uL (ref 0–0.1)
BASOS PCT: 1 %
EOS ABS: 0 10*3/uL (ref 0–0.7)
Eosinophils Relative: 0 %
HEMATOCRIT: 42.6 % (ref 40.0–52.0)
HEMOGLOBIN: 14.3 g/dL (ref 13.0–18.0)
Lymphocytes Relative: 26 %
Lymphs Abs: 2.3 10*3/uL (ref 1.0–3.6)
MCH: 30.3 pg (ref 26.0–34.0)
MCHC: 33.5 g/dL (ref 32.0–36.0)
MCV: 90.3 fL (ref 80.0–100.0)
MONO ABS: 0.4 10*3/uL (ref 0.2–1.0)
Monocytes Relative: 5 %
NEUTROS ABS: 6 10*3/uL (ref 1.4–6.5)
NEUTROS PCT: 68 %
Platelets: 270 10*3/uL (ref 150–440)
RBC: 4.71 MIL/uL (ref 4.40–5.90)
RDW: 13.3 % (ref 11.5–14.5)
WBC: 8.8 10*3/uL (ref 3.8–10.6)

## 2015-04-30 LAB — SALICYLATE LEVEL: Salicylate Lvl: <4 mg/dL (ref 2.8–30.0)

## 2015-04-30 LAB — URINE DRUG SCREEN, QUALITATIVE (ARMC ONLY)
Amphetamines, Ur Screen: POSITIVE — AB
BARBITURATES, UR SCREEN: NOT DETECTED
BENZODIAZEPINE, UR SCRN: NOT DETECTED
CANNABINOID 50 NG, UR ~~LOC~~: NOT DETECTED
Cocaine Metabolite,Ur ~~LOC~~: NOT DETECTED
MDMA (Ecstasy)Ur Screen: NOT DETECTED
Methadone Scn, Ur: NOT DETECTED
Opiate, Ur Screen: NOT DETECTED
PHENCYCLIDINE (PCP) UR S: NOT DETECTED
Tricyclic, Ur Screen: NOT DETECTED

## 2015-04-30 LAB — ETHANOL: Alcohol, Ethyl (B): <5 mg/dL (ref <5)

## 2015-04-30 LAB — TROPONIN I: Troponin I: <0.03 ng/mL (ref <0.031)

## 2015-04-30 MED ORDER — MELOXICAM 15 MG PO TABS
15.0000 mg | ORAL_TABLET | Freq: Every day | ORAL | Status: DC | PRN
  Filled 2015-04-30: qty 1

## 2015-04-30 MED ORDER — TRAZODONE HCL 100 MG PO TABS: 100.0000 mg | ORAL_TABLET | Freq: Every evening | ORAL | Status: DC | PRN

## 2015-04-30 MED ORDER — LORAZEPAM 1 MG PO TABS
1.0000 mg | ORAL_TABLET | Freq: Once | ORAL | Status: AC
  Administered 2015-04-30: 1 mg via ORAL
  Filled 2015-04-30: qty 1

## 2015-04-30 MED ORDER — LORAZEPAM 2 MG/ML IJ SOLN
1.0000 mg | INTRAMUSCULAR | Status: DC
  Administered 2015-04-30: 1 mg via INTRAMUSCULAR
  Filled 2015-04-30: qty 1

## 2015-04-30 MED ORDER — AMPHETAMINE-DEXTROAMPHETAMINE 5 MG PO TABS
30.0000 mg | ORAL_TABLET | Freq: Every day | ORAL | Status: DC
  Administered 2015-05-01: 30 mg via ORAL
  Filled 2015-04-30: qty 6

## 2015-04-30 MED ORDER — LORAZEPAM 1 MG PO TABS: 1.0000 mg | ORAL_TABLET | ORAL | Status: DC | PRN

## 2015-04-30 MED ORDER — BUSPIRONE HCL 10 MG PO TABS
20.0000 mg | ORAL_TABLET | Freq: Two times a day (BID) | ORAL | Status: DC
  Administered 2015-04-30 – 2015-05-01 (×2): 20 mg via ORAL
  Filled 2015-04-30 (×2): qty 2
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
## 2015-04-30 NOTE — ED Notes (Signed)

## 2015-04-30 NOTE — ED Notes (Signed)
Per DR. Schavetiz pt to not be dressed out at this time

## 2015-04-30 NOTE — ED Notes (Signed)
Received report from RN Noel  [facility]
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
## 2015-04-30 NOTE — ED Notes (Signed)
Pt states fast heartbeat, feeling anxious with "marital problems", pt shaking, states hx of anxiety and on buspar daily, mother at bedside, pt answers questions with even respirations, pt denies SI or HI but states he would like to talk to a psycharitrist

## 2015-04-30 NOTE — ED Notes (Signed)
Pt brought into ED BHU via sally port esccorted by RN Danelle EarthlyNoel. Pt wanded with metal detector for safety by ODS Officer [place]. Patient oriented to unit/care area: Pt informed of unit policies and procedures.  Informed that, for their safety, care areas are designed for safety and monitored by security cameras at all times; and visiting hours explained to patient. Patient verbalizes understanding, and verbal contract for safety obtained.Pt shown to their room--#1.  BEHAVIORAL HEALTH ROUNDING Patient sleeping: No. Patient alert and oriented: yes Behavior appropriate: Yes.   Nutrition and fluids offered: Yes  Toileting and hygiene offered: Yes  Sitter present: q15 min observations and security camera monitoring Law enforcement present: Yes [employer]   ENVIRONMENTAL ASSESSMENT Potentially harmful objects out of patient reach: Yes.   Personal belongings secured: Yes.   Patient dressed in hospital provided attire only: Yes.   Plastic bags out of patient reach: Yes.   Patient care equipment removed: Yes.   Equipment and supplies removed: Yes.   Potentially toxic materials out of patient reach: Yes.   Sharps container removed or out of patient reach: Yes.
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
## 2015-05-01 ENCOUNTER — Inpatient Hospital Stay
Admission: EM | Admit: 2015-05-01 | Discharge: 2015-05-03 | DRG: 880 | Disposition: A | Discharge status: Home / Self Care | Payer: Medicare Other | Source: Ambulatory Visit | Attending: Psychiatry | Admitting: Psychiatry

## 2015-05-01 ENCOUNTER — Inpatient Hospital Stay: Admission: AD | Admit: 2015-05-01 | Payer: Medicare Other | Source: Ambulatory Visit | Admitting: Psychiatry

## 2015-05-01 DIAGNOSIS — R45851 Suicidal ideations: Secondary | ICD-10-CM | POA: Diagnosis present

## 2015-05-01 DIAGNOSIS — F4320 Adjustment disorder, unspecified: Secondary | ICD-10-CM | POA: Diagnosis present

## 2015-05-01 DIAGNOSIS — G4700 Insomnia, unspecified: Secondary | ICD-10-CM | POA: Diagnosis present

## 2015-05-01 DIAGNOSIS — J4520 Mild intermittent asthma, uncomplicated: Secondary | ICD-10-CM | POA: Diagnosis present

## 2015-05-01 DIAGNOSIS — Z808 Family history of malignant neoplasm of other organs or systems: Secondary | ICD-10-CM | POA: Diagnosis not present

## 2015-05-01 DIAGNOSIS — Z803 Family history of malignant neoplasm of breast: Secondary | ICD-10-CM | POA: Diagnosis not present

## 2015-05-01 DIAGNOSIS — Z915 Personal history of self-harm: Secondary | ICD-10-CM

## 2015-05-01 DIAGNOSIS — Z818 Family history of other mental and behavioral disorders: Secondary | ICD-10-CM | POA: Diagnosis not present

## 2015-05-01 DIAGNOSIS — F900 Attention-deficit hyperactivity disorder, predominantly inattentive type: Secondary | ICD-10-CM | POA: Diagnosis present

## 2015-05-01 DIAGNOSIS — Z791 Long term (current) use of non-steroidal anti-inflammatories (NSAID): Secondary | ICD-10-CM | POA: Diagnosis not present

## 2015-05-01 DIAGNOSIS — Z630 Problems in relationship with spouse or partner: Secondary | ICD-10-CM | POA: Diagnosis not present

## 2015-05-01 DIAGNOSIS — F339 Major depressive disorder, recurrent, unspecified: Secondary | ICD-10-CM | POA: Diagnosis not present

## 2015-05-01 DIAGNOSIS — F410 Panic disorder [episodic paroxysmal anxiety] without agoraphobia: Secondary | ICD-10-CM | POA: Diagnosis present

## 2015-05-01 DIAGNOSIS — F419 Anxiety disorder, unspecified: Secondary | ICD-10-CM | POA: Diagnosis not present

## 2015-05-01 DIAGNOSIS — Z87891 Personal history of nicotine dependence: Secondary | ICD-10-CM | POA: Diagnosis not present

## 2015-05-01 DIAGNOSIS — F909 Attention-deficit hyperactivity disorder, unspecified type: Secondary | ICD-10-CM | POA: Diagnosis present

## 2015-05-01 DIAGNOSIS — F3341 Major depressive disorder, recurrent, in partial remission: Secondary | ICD-10-CM | POA: Diagnosis present

## 2015-05-01 DIAGNOSIS — Z79899 Other long term (current) drug therapy: Secondary | ICD-10-CM

## 2015-05-01 DIAGNOSIS — F332 Major depressive disorder, recurrent severe without psychotic features: Secondary | ICD-10-CM | POA: Diagnosis present

## 2015-05-01 DIAGNOSIS — Z9889 Other specified postprocedural states: Secondary | ICD-10-CM

## 2015-05-01 LAB — LIPID PANEL
Cholesterol: 162 mg/dL (ref 0–200)
HDL: 40 mg/dL — ABNORMAL LOW (ref >40)
LDL CALC: 111 mg/dL — AB (ref 0–99)
Total CHOL/HDL Ratio: 4.1 RATIO
Triglycerides: 53 mg/dL (ref <150)
VLDL: 11 mg/dL (ref 0–40)

## 2015-05-01 LAB — TSH: TSH: 1.707 u[IU]/mL (ref 0.350–4.500)

## 2015-05-01 MED ORDER — LORAZEPAM 1 MG PO TABS
1.0000 mg | ORAL_TABLET | ORAL | Status: DC | PRN
  Administered 2015-05-01: 1 mg via ORAL
  Filled 2015-05-01: qty 1

## 2015-05-01 MED ORDER — LORAZEPAM 2 MG/ML IJ SOLN
1.0000 mg | INTRAMUSCULAR | Status: AC
  Administered 2015-05-01: 1 mg via INTRAMUSCULAR
  Filled 2015-05-01: qty 1

## 2015-05-01 MED ORDER — TRAZODONE HCL 100 MG PO TABS
100.0000 mg | ORAL_TABLET | Freq: Every evening | ORAL | Status: DC | PRN
  Administered 2015-05-01: 100 mg via ORAL
  Filled 2015-05-01: qty 1

## 2015-05-01 MED ORDER — MAGNESIUM HYDROXIDE 400 MG/5ML PO SUSP: 30.0000 mL | Freq: Every day | ORAL | Status: DC | PRN

## 2015-05-01 MED ORDER — ALUM & MAG HYDROXIDE-SIMETH 200-200-20 MG/5ML PO SUSP: 30.0000 mL | ORAL | Status: DC | PRN

## 2015-05-01 MED ORDER — MELOXICAM 15 MG PO TABS
15.0000 mg | ORAL_TABLET | Freq: Every day | ORAL | Status: DC | PRN
  Filled 2015-05-01: qty 1

## 2015-05-01 MED ORDER — AMPHETAMINE-DEXTROAMPHETAMINE 20 MG PO TABS
30.0000 mg | ORAL_TABLET | Freq: Every day | ORAL | Status: DC
  Filled 2015-05-01: qty 1

## 2015-05-01 MED ORDER — ACETAMINOPHEN 325 MG PO TABS
650.0000 mg | ORAL_TABLET | Freq: Four times a day (QID) | ORAL | Status: DC | PRN
  Administered 2015-05-02: 650 mg via ORAL
  Filled 2015-05-01: qty 2

## 2015-05-01 MED ORDER — BUSPIRONE HCL 10 MG PO TABS
20.0000 mg | ORAL_TABLET | Freq: Two times a day (BID) | ORAL | Status: DC
  Administered 2015-05-01 – 2015-05-03 (×4): 20 mg via ORAL
  Filled 2015-05-01 (×4): qty 2

## 2015-05-01 NOTE — ED Notes (Signed)
BEHAVIORAL HEALTH ROUNDING Patient sleeping: Yes.   Patient alert and oriented: not applicable Behavior appropriate: Yes.    Nutrition and fluids offered: No Toileting and hygiene offered: No Sitter present: q15 minute observations and security camera monitoring Law enforcement present: Yes [employer] 

## 2015-05-01 NOTE — ED Notes (Signed)
NAD Noted at this time. Pt sitting in chair speaking with tech, Bernette RedbirdKenny. Respirations noted to be even and unlabored at this time.

## 2015-05-01 NOTE — ED Provider Notes (Addendum)
-----------------------------------------   7:03 AM on 05/01/2015 -----------------------------------------   Blood pressure 118/79, pulse 100, temperature 97.8 F (36.6 C), temperature source Oral, resp. rate 16, height 5\' 9"  (1.753 m), weight 250 lb (113.399 kg), SpO2 100 %.  The patient had no acute events since last update.  Calm and cooperative at this time.  Disposition is pending per Psychiatry/Behavioral Medicine team recommendations, and Dr. Toni Amendlapacs is recommending admission.   ----------------------------------------- 2:43 PM on 05/01/2015 -----------------------------------------  Informed that the patient is being taken downstairs to the behavioral unit for major depressive disorder.  Loleta Roseory Krystall Kruckenberg, MD 05/01/15 1444

## 2015-05-01 NOTE — ED Notes (Signed)
NAD Noted at this time. Pt resting in chair with his eyes open watching TV. Respirations even and unlabored at this time.

## 2015-05-01 NOTE — Progress Notes (Signed)
Patient pleasant and cooperative during admission assessment. Patient denies SI/HI at this time. Patient denies Auditory/ Visual Hallucinations. Patient informed of fall risk status, fall risk assessed "low" at this time. Patient oriented to unit/staff/room. Patient denies any questions/concerns at this time. Patient safe on unit with Q15 minute checks for safety. Patient was searched, skin was intact and no contraband found.

## 2015-05-01 NOTE — ED Notes (Signed)
NAD noted at this time. Pt cooperative with staff. Pt noted to be alert and oriented at this time. Will continue to monitor with 15 min safety rounds.

## 2015-05-01 NOTE — ED Notes (Signed)
NAD noted at this time. Pt sitting up in chair at this time watching TV. Respirations even and unlabored.

## 2015-05-01 NOTE — ED Notes (Signed)
Pt in room. No complaints or concerns voiced at this time. No abnormal behavior noted at this time. Will continue to monitor with q15 min checks and security camera monitoring. [employer] officer in area. 

## 2015-05-01 NOTE — ED Notes (Signed)
NAD noted at this time. Pt is calm and cooperative with staff. Pt is having a supervised visit with his wife in the day room.

## 2015-05-01 NOTE — ED Notes (Signed)

## 2015-05-01 NOTE — Progress Notes (Signed)
Pt. is to be [facility] by Dr. Toni Amendlapacs. Attending Physician will be Dr. Jennet MaduroPucilowska. Pt. has been [facility] to room 311A, [facility] [place]. Intake Paper Work has been signed and placed on pt. chart. ER staff French Ana( [facility].; Dr. York CeriseForbach, ER MD; Aundra MilletMegan Patient's Nurse & Marliss CzarLeigh Patient Access) have been made aware of the admission.         05/01/2015 Cheryl FlashNicole Eugene Zeiders, MS, NCC, LPCA Therapeutic Triage Specialist
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
## 2015-05-01 NOTE — ED Notes (Signed)
Pt to be admitted to downstairs BHU. NAD noted at this time. Pt ambulatory with staff.

## 2015-05-01 NOTE — ED Notes (Signed)
ED BHU PLACEMENT JUSTIFICATION Is the patient under IVC or is there intent for IVC: No. Is the patient medically cleared: Yes.   Is there vacancy in the ED BHU: Yes.   Is the population mix appropriate for patient: Yes.   Is the patient awaiting placement in inpatient or outpatient setting: Yes.   Has the patient had a psychiatric consult: Yes.   Survey of unit performed for contraband, proper placement and condition of furniture, tampering with fixtures in bathroom, shower, and each patient room: Yes.  ; Findings:  APPEARANCE/BEHAVIOR calm, cooperative and adequate rapport can be established NEURO ASSESSMENT Orientation: time, place and person Hallucinations: No.None noted (Hallucinations) Speech: Normal Gait: normal RESPIRATORY ASSESSMENT Normal expansion.  Clear to auscultation.  No rales, rhonchi, or wheezing. CARDIOVASCULAR ASSESSMENT regular rate and rhythm, S1, S2 normal, no murmur, click, rub or gallop GASTROINTESTINAL ASSESSMENT soft, nontender, BS WNL, no r/g EXTREMITIES normal strength, tone, and muscle mass PLAN OF CARE Provide calm/safe environment. Vital signs assessed twice daily. ED BHU Assessment once each 12-hour shift. Collaborate with intake RN daily or as condition indicates. Assure the ED provider has rounded once each shift. Provide and encourage hygiene. Provide redirection as needed. Assess for escalating behavior; address immediately and inform ED provider.  Assess family dynamic and appropriateness for visitation as needed: Yes.  ; If necessary, describe findings:  Educate the patient/family about BHU procedures/visitation: Yes.  ; If necessary, describe findings:  

## 2015-05-01 NOTE — ED Notes (Signed)
NAD noted at this time. Pt appears in NAD. Pt states that he feels better after taking his medication and that he feels less anxious. Denies SI/HI or any A/V hallucinations at this time. Pt ambulatory to and from the bathroom. Respirations noted to be even and unlabored at this time. Pt awaiting tx to Behavioral Med Unit.
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
## 2015-05-01 NOTE — ED Notes (Signed)
BEHAVIORAL HEALTH ROUNDING Patient sleeping: No. Patient alert and oriented: yes Behavior appropriate: Yes.  ; If no, describe:  Nutrition and fluids offered: Yes Toileting and hygiene offered: Yes  Sitter present: yes Law enforcement present: Yes [employer]  

## 2015-05-01 NOTE — ED Notes (Signed)
Patient assigned to appropriate care area. Patient oriented to unit/care area: Informed that, for their safety, care areas are designed for safety and monitored by security cameras at all times; and visiting hours explained to patient. Patient verbalizes understanding, and verbal contract for safety obtained. 

## 2015-05-01 NOTE — ED Notes (Signed)
Pt visualized in NAD. Pt sitting in chair watching TV. Pt given a lunch tray at this time. Will continue with 15 min safety rounds.

## 2015-05-02 DIAGNOSIS — F332 Major depressive disorder, recurrent severe without psychotic features: Secondary | ICD-10-CM

## 2015-05-02 LAB — HEMOGLOBIN A1C: Hgb A1c MFr Bld: 5.3 % (ref 4.0–6.0)

## 2015-05-02 MED ORDER — VENLAFAXINE HCL ER 37.5 MG PO CP24
37.5000 mg | ORAL_CAPSULE | Freq: Every morning | ORAL | Status: AC
  Administered 2015-05-02 – 2015-05-03 (×2): 37.5 mg via ORAL
  Filled 2015-05-02 (×2): qty 1

## 2015-05-02 MED ORDER — CLONAZEPAM 0.5 MG PO TABS
0.5000 mg | ORAL_TABLET | Freq: Once | ORAL | Status: AC
  Administered 2015-05-02: 0.5 mg via ORAL
  Filled 2015-05-02: qty 1

## 2015-05-02 MED ORDER — CLONAZEPAM 0.5 MG PO TABS
0.5000 mg | ORAL_TABLET | Freq: Two times a day (BID) | ORAL | Status: DC
  Administered 2015-05-02 – 2015-05-03 (×3): 0.5 mg via ORAL
  Filled 2015-05-02 (×3): qty 1

## 2015-05-02 MED ORDER — AMPHETAMINE-DEXTROAMPHETAMINE 5 MG PO TABS
30.0000 mg | ORAL_TABLET | Freq: Every day | ORAL | Status: DC
  Administered 2015-05-02 – 2015-05-03 (×2): 30 mg via ORAL
  Filled 2015-05-02: qty 6

## 2015-05-02 MED ORDER — VENLAFAXINE HCL ER 75 MG PO CP24: 75.0000 mg | ORAL_CAPSULE | Freq: Every morning | ORAL | Status: DC

## 2015-05-02 NOTE — Progress Notes (Signed)
In dayroom watching TV and interacting with peers at onset of shift. Was medication complaint. Denied AVH, SI, HI and was pleasant to interact with. Retreated to room for PM rest and had an uneventful night.
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
## 2015-05-02 NOTE — Progress Notes (Signed)
Presents with sad flat affect.  Denies SI, verbalizes that he is having problems with anxiety.  Avoids eye contact.  Medication and group compliant.  Noted in day room watching TV and coloring.  No inappropriate behavior noted.

## 2015-05-02 NOTE — Plan of Care (Signed)
Problem: Alteration in mood; excessive anxiety as evidenced by: Goal: LTG-Patient's behavior demonstrates decreased anxiety (Patient's behavior demonstrates anxiety and he/she is utilizing learned coping skills to deal with anxiety-producing situations)  Outcome: Not Progressing Continues to verbalize that has anxiety

## 2015-05-02 NOTE — [facility] Group Notes (Signed)
[facility] Group Notes:  (Nursing/MHT/Case Management/Adjunct)  Date:  05/02/2015  Time:  9:28 AM  Type of Therapy:  Goal-Setting  Participation Level:  Active  Participation Quality:  Appropriate and Sharing  Affect:  Appropriate  Cognitive:  Alert and Appropriate  Insight:  Appropriate and Good  Engagement in Group:  Engaged  Modes of Intervention:  Discussion  Summary of Progress/Problems:  Isabelle CourseWhitney R Ivette Castronova 05/02/2015, 9:28 AM
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
## 2015-05-03 MED ORDER — CLONAZEPAM 0.5 MG PO TABS: 0.5000 mg | ORAL_TABLET | Freq: Two times a day (BID) | ORAL | Status: DC

## 2015-05-03 MED ORDER — TRAZODONE HCL 100 MG PO TABS: 100.0000 mg | ORAL_TABLET | Freq: Every evening | ORAL | Status: DC | PRN

## 2015-05-03 MED ORDER — VENLAFAXINE HCL ER 75 MG PO CP24: 75.0000 mg | ORAL_CAPSULE | Freq: Every morning | ORAL | Status: DC

## 2015-05-03 MED ORDER — MELOXICAM 15 MG PO TABS: 15.0000 mg | ORAL_TABLET | Freq: Every day | ORAL | Status: DC | PRN
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
## 2015-05-03 NOTE — Progress Notes (Signed)
Patient denies SI/HI, denies A/V hallucinations. Patient verbalizes understanding of discharge instructions, follow up care and prescriptions. Patient given all belongings from locker. Patient escorted out by staff.

## 2015-05-03 NOTE — [facility] Group Notes (Signed)
[facility] LCSW Group Therapy  05/03/2015 3:37 PM  Type of Therapy:  Group Therapy  Participation Level:  Active  Participation Quality:  Appropriate and Attentive  Affect:  Appropriate  Cognitive:  Alert, Appropriate and Oriented  Insight:  Engaged  Engagement in Therapy:  Engaged  Modes of Intervention:  Discussion, Socialization and Support  Summary of Progress/Problems: Patient attended and participated in group discussion appropriately. Patient introduced himself and participated in introductions appropriately. Patient shared that this group has offered him the most opportunity to open up about his situation and cannot talk to family about the things he does here. Patient was able to relate to other group members in that his faith is key to his coping and that maturing physically, mentally, and socially and spiritually is what will help him to be successful in the future. Patient was able to relate to other group members and offered support to other group members.   Lulu RidingIngle, Myanna Ziesmer T, MSW, LCSWA 05/03/2015, 3:37 PM

## 2015-05-03 NOTE — [facility] Suicide Risk Assessment (Signed)
[facility] INPATIENT:  Family/Significant Other Suicide Prevention Education  Suicide Prevention Education:  Education Completed; Derrick Gutierrez, wife ,  (name of family member/significant other) has been identified by the patient as the family member/significant other with whom the patient will be residing, and identified as the person(s) who will aid the patient in the event of a mental health crisis (suicidal ideations/suicide attempt).  With written consent from the patient, the family member/significant other has been provided the following suicide prevention education, prior to the and/or following the discharge of the patient.  The suicide prevention education provided includes the following:  Suicide risk factors  Suicide prevention and interventions  National Suicide Hotline telephone number  [facility] assessment telephone number  [facility] Emergency Assistance 911  [facility] and/or Residential Mobile Crisis Unit telephone number  Request made of family/significant other to:  Remove weapons (e.g., guns, rifles, knives), all items previously/currently identified as safety concern.    Remove drugs/medications (over-the-counter, prescriptions, illicit drugs), all items previously/currently identified as a safety concern.  The family member/significant other verbalizes understanding of the suicide prevention education information provided.  The family member/significant other agrees to remove the items of safety concern listed above.  Glennon MacLaws, Adriyana Greenbaum P, MSW, LCSW 05/03/2015, 4:03 PM

## 2015-05-03 NOTE — Progress Notes (Signed)
Recreation Therapy Notes  Date: 10.31.16 Time: 3:00 pm Location: Craft Room  Group Topic: Wellness  Goal Area(s) Addresses:  Patient will identify at least one item per dimension of health. Patient will examine areas they are deficient in.  Behavioral Response: Attentive, Interactive  Intervention: 6 Dimensions of Health  Activity: Patients were given a worksheet with the definitions of the 6 dimensions of health. Patients were given a second worksheet with the 6 dimensions of health on it and instructed to write out at least one item they are currently doing in each dimension.  Education: LRT educated patients on way they can improve each dimension.  Education Outcome: Acknowledges education/In group clarification offered  Clinical Observations/Feedback: Patient completed activity by writing at least 2 items in each dimension. Patient contributed to group discussion by stating way he can improve certain wellness areas.  Jacquelynn CreeGreene,Brayleigh Rybacki M 05/03/2015 4:12 PM

## 2015-05-03 NOTE — Progress Notes (Signed)
Pleasant to interact with and medication compliant. Denied A,V,H and SI, HI. Had an uneventful night.  

## 2015-05-03 NOTE — Progress Notes (Signed)
  [facility]  Will you be returning to the same living situation after discharge:  Yes,    At discharge, do you have transportation home?: No., Pt requested to walk across the street to Mother's home Do you have the ability to pay for your medications: Yes,     Release of information consent forms completed and in the chart;  Patient's signature needed at discharge.  Patient to Follow up at: Follow-up Information    [facility] with [facility] Go on 06/10/2015.   Why:  4:00pm, Unable to schedule appointment prior to this one.  You may walk in Mon/Tues/Thurs between 9-11am.  Wednesdays at 9am-3pm.  [place] in [place] office if you would like to see her.   Contact information:   [address] [place] [phone] [phone]      Follow up with [facility].   Why:  Referral Made, please follow up with them by phone.  Should this referral not work out, you may also consider checking in with [facility] [phone] or [facility] [phone]   Contact information:   [address] [place] [address] [phone] [phone]      Patient denies SI/HI: Yes,       Safety Planning and Suicide Prevention discussed: Yes,     Have you used any form of tobacco in the last 30 days? (Cigarettes, Smokeless Tobacco, Cigars, and/or Pipes): No  Has patient been referred to the Quitline?: N/A patient is not a smoker  Eboni Coval, Cleda DaubSara P, MSW, LCSWA 05/03/2015, 4:02 PM
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
## 2015-05-04 MED ORDER — TRAZODONE HCL 100 MG PO TABS: 100.0000 mg | ORAL_TABLET | Freq: Every evening | ORAL | Status: DC | PRN

## 2015-05-04 MED ORDER — VENLAFAXINE HCL ER 75 MG PO CP24: 75.0000 mg | ORAL_CAPSULE | Freq: Every morning | ORAL | Status: DC

## 2015-05-04 MED ORDER — MELOXICAM 15 MG PO TABS: 15.0000 mg | ORAL_TABLET | Freq: Every day | ORAL | Status: DC | PRN

## 2015-05-04 MED ORDER — CLONAZEPAM 0.5 MG PO TABS: 0.5000 mg | ORAL_TABLET | Freq: Two times a day (BID) | ORAL | Status: DC

## 2015-05-17 ENCOUNTER — Encounter: Payer: Self-pay | Admitting: Emergency Medicine

## 2015-05-17 ENCOUNTER — Emergency Department
Admission: EM | Admit: 2015-05-17 | Discharge: 2015-05-18 | Disposition: A | Discharge status: Other Acute Inpatient Hospital | Payer: Medicare Other | Attending: Emergency Medicine | Admitting: Emergency Medicine

## 2015-05-17 ENCOUNTER — Other Ambulatory Visit: Payer: Self-pay

## 2015-05-17 ENCOUNTER — Ambulatory Visit: Payer: Self-pay | Admitting: Family Medicine

## 2015-05-17 DIAGNOSIS — F329 Major depressive disorder, single episode, unspecified: Secondary | ICD-10-CM | POA: Diagnosis not present

## 2015-05-17 DIAGNOSIS — Z79899 Other long term (current) drug therapy: Secondary | ICD-10-CM | POA: Insufficient documentation

## 2015-05-17 DIAGNOSIS — Z791 Long term (current) use of non-steroidal anti-inflammatories (NSAID): Secondary | ICD-10-CM | POA: Diagnosis not present

## 2015-05-17 DIAGNOSIS — R45851 Suicidal ideations: Secondary | ICD-10-CM

## 2015-05-17 DIAGNOSIS — Y9289 Other specified places as the place of occurrence of the external cause: Secondary | ICD-10-CM | POA: Diagnosis not present

## 2015-05-17 DIAGNOSIS — F419 Anxiety disorder, unspecified: Secondary | ICD-10-CM | POA: Diagnosis not present

## 2015-05-17 DIAGNOSIS — F332 Major depressive disorder, recurrent severe without psychotic features: Secondary | ICD-10-CM | POA: Diagnosis not present

## 2015-05-17 DIAGNOSIS — T43212A Poisoning by selective serotonin and norepinephrine reuptake inhibitors, intentional self-harm, initial encounter: Secondary | ICD-10-CM | POA: Insufficient documentation

## 2015-05-17 DIAGNOSIS — Y9389 Activity, other specified: Secondary | ICD-10-CM | POA: Insufficient documentation

## 2015-05-17 DIAGNOSIS — T1491 Suicide attempt: Secondary | ICD-10-CM | POA: Diagnosis not present

## 2015-05-17 DIAGNOSIS — Y998 Other external cause status: Secondary | ICD-10-CM | POA: Insufficient documentation

## 2015-05-17 DIAGNOSIS — F3341 Major depressive disorder, recurrent, in partial remission: Secondary | ICD-10-CM | POA: Diagnosis present

## 2015-05-17 DIAGNOSIS — T50902A Poisoning by unspecified drugs, medicaments and biological substances, intentional self-harm, initial encounter: Secondary | ICD-10-CM

## 2015-05-17 LAB — COMPREHENSIVE METABOLIC PANEL
ALBUMIN: 4.3 g/dL (ref 3.5–5.0)
ALT: 11 U/L — ABNORMAL LOW (ref 17–63)
ANION GAP: 7 (ref 5–15)
AST: 17 U/L (ref 15–41)
Alkaline Phosphatase: 111 U/L (ref 38–126)
BILIRUBIN TOTAL: 0.6 mg/dL (ref 0.3–1.2)
BUN: 14 mg/dL (ref 6–20)
CO2: 26 mmol/L (ref 22–32)
Calcium: 9.1 mg/dL (ref 8.9–10.3)
Chloride: 107 mmol/L (ref 101–111)
Creatinine, Ser: 1.05 mg/dL (ref 0.61–1.24)
GFR calc Af Amer: >60 mL/min (ref >60)
GFR calc non Af Amer: >60 mL/min (ref >60)
GLUCOSE: 107 mg/dL — AB (ref 65–99)
POTASSIUM: 3.4 mmol/L — AB (ref 3.5–5.1)
SODIUM: 140 mmol/L (ref 135–145)
TOTAL PROTEIN: 8.2 g/dL — AB (ref 6.5–8.1)

## 2015-05-17 LAB — CBC
HEMATOCRIT: 44 % (ref 40.0–52.0)
HEMOGLOBIN: 14.6 g/dL (ref 13.0–18.0)
MCH: 30.2 pg (ref 26.0–34.0)
MCHC: 33.3 g/dL (ref 32.0–36.0)
MCV: 90.7 fL (ref 80.0–100.0)
Platelets: 256 10*3/uL (ref 150–440)
RBC: 4.85 MIL/uL (ref 4.40–5.90)
RDW: 13.3 % (ref 11.5–14.5)
WBC: 9.5 10*3/uL (ref 3.8–10.6)

## 2015-05-17 LAB — GLUCOSE, CAPILLARY: Glucose-Capillary: 96 mg/dL (ref 65–99)

## 2015-05-17 NOTE — ED Notes (Signed)
Pt's family updated with pt's permission.  Pt's sister and mother present in lobby.  Family request call pt's mother Morene RankinsMarshell with updates [phone]

## 2015-05-17 NOTE — ED Notes (Signed)
Pt to [facility] via EMS.  EMS report pt OD on 15-20 100mg  trazodone after argument with wife, hx of depression.  Pt denies any current SI.  Pt NAD at this time. +

## 2015-05-17 NOTE — ED Notes (Addendum)
Patient assigned to appropriate care area. Patient oriented to unit/care area: Informed that, for their safety, care areas are designed for safety and monitored by security cameras at all times; and visiting hours explained to patient. Patient verbalizes understanding, and verbal contract for safety obtained.  ED BHU PLACEMENT JUSTIFICATION Is the patient under IVC or is there intent for IVC: No. Is the patient medically cleared: No. Is there vacancy in the ED BHU: Yes.   Is the population mix appropriate for patient: No. Is the patient awaiting placement in inpatient or outpatient setting: No. Has the patient had a psychiatric consult: No. Survey of unit performed for contraband, proper placement and condition of furniture, tampering with fixtures in bathroom, shower, and each patient room: Yes.  ; Findings:  APPEARANCE/BEHAVIOR calm and cooperative NEURO ASSESSMENT Orientation: time, place and person Hallucinations: No.None noted (Hallucinations) Speech: Normal Gait: normal RESPIRATORY ASSESSMENT Normal expansion.  Clear to auscultation.  No rales, rhonchi, or wheezing. CARDIOVASCULAR ASSESSMENT regular rate and rhythm, S1, S2 normal, no murmur, click, rub or gallop GASTROINTESTINAL ASSESSMENT soft, nontender, BS WNL, no r/g EXTREMITIES normal strength, tone, and muscle mass PLAN OF CARE Provide calm/safe environment. Vital signs assessed twice daily. ED BHU Assessment once each 12-hour shift. Collaborate with intake RN daily or as condition indicates. Assure the ED provider has rounded once each shift. Provide and encourage hygiene. Provide redirection as needed. Assess for escalating behavior; address immediately and inform ED provider.  Assess family dynamic and appropriateness for visitation as needed: Yes.  ; If necessary, describe findings:  Educate the patient/family about BHU procedures/visitation: Yes.  ; If necessary, describe findings:

## 2015-05-18 ENCOUNTER — Inpatient Hospital Stay
Admit: 2015-05-18 | Discharge: 2015-05-21 | DRG: 885 | Disposition: A | Discharge status: Home / Self Care | Payer: 59 | Source: Intra-hospital | Attending: Psychiatry | Admitting: Psychiatry

## 2015-05-18 DIAGNOSIS — J4520 Mild intermittent asthma, uncomplicated: Secondary | ICD-10-CM | POA: Diagnosis present

## 2015-05-18 DIAGNOSIS — Z79899 Other long term (current) drug therapy: Secondary | ICD-10-CM

## 2015-05-18 DIAGNOSIS — M549 Dorsalgia, unspecified: Secondary | ICD-10-CM | POA: Diagnosis present

## 2015-05-18 DIAGNOSIS — F329 Major depressive disorder, single episode, unspecified: Secondary | ICD-10-CM | POA: Diagnosis not present

## 2015-05-18 DIAGNOSIS — J45909 Unspecified asthma, uncomplicated: Secondary | ICD-10-CM | POA: Diagnosis present

## 2015-05-18 DIAGNOSIS — G4700 Insomnia, unspecified: Secondary | ICD-10-CM | POA: Diagnosis present

## 2015-05-18 DIAGNOSIS — F5232 Male orgasmic disorder: Secondary | ICD-10-CM | POA: Diagnosis present

## 2015-05-18 DIAGNOSIS — Z9889 Other specified postprocedural states: Secondary | ICD-10-CM | POA: Diagnosis not present

## 2015-05-18 DIAGNOSIS — G8929 Other chronic pain: Secondary | ICD-10-CM | POA: Diagnosis present

## 2015-05-18 DIAGNOSIS — T43211A Poisoning by selective serotonin and norepinephrine reuptake inhibitors, accidental (unintentional), initial encounter: Secondary | ICD-10-CM | POA: Diagnosis present

## 2015-05-18 DIAGNOSIS — F909 Attention-deficit hyperactivity disorder, unspecified type: Secondary | ICD-10-CM | POA: Diagnosis present

## 2015-05-18 DIAGNOSIS — T43212A Poisoning by selective serotonin and norepinephrine reuptake inhibitors, intentional self-harm, initial encounter: Secondary | ICD-10-CM | POA: Diagnosis not present

## 2015-05-18 DIAGNOSIS — F332 Major depressive disorder, recurrent severe without psychotic features: Secondary | ICD-10-CM | POA: Diagnosis not present

## 2015-05-18 DIAGNOSIS — Z915 Personal history of self-harm: Secondary | ICD-10-CM

## 2015-05-18 DIAGNOSIS — F419 Anxiety disorder, unspecified: Secondary | ICD-10-CM | POA: Diagnosis not present

## 2015-05-18 DIAGNOSIS — E669 Obesity, unspecified: Secondary | ICD-10-CM | POA: Diagnosis present

## 2015-05-18 DIAGNOSIS — Z801 Family history of malignant neoplasm of trachea, bronchus and lung: Secondary | ICD-10-CM

## 2015-05-18 DIAGNOSIS — Z818 Family history of other mental and behavioral disorders: Secondary | ICD-10-CM | POA: Diagnosis not present

## 2015-05-18 DIAGNOSIS — Z791 Long term (current) use of non-steroidal anti-inflammatories (NSAID): Secondary | ICD-10-CM | POA: Diagnosis not present

## 2015-05-18 DIAGNOSIS — F900 Attention-deficit hyperactivity disorder, predominantly inattentive type: Secondary | ICD-10-CM | POA: Diagnosis present

## 2015-05-18 DIAGNOSIS — R4183 Borderline intellectual functioning: Secondary | ICD-10-CM

## 2015-05-18 DIAGNOSIS — F3341 Major depressive disorder, recurrent, in partial remission: Secondary | ICD-10-CM | POA: Diagnosis present

## 2015-05-18 LAB — URINE DRUG SCREEN, QUALITATIVE (ARMC ONLY)
AMPHETAMINES, UR SCREEN: POSITIVE — AB
Barbiturates, Ur Screen: NOT DETECTED
Benzodiazepine, Ur Scrn: POSITIVE — AB
CANNABINOID 50 NG, UR ~~LOC~~: NOT DETECTED
COCAINE METABOLITE, UR ~~LOC~~: NOT DETECTED
MDMA (ECSTASY) UR SCREEN: NOT DETECTED
METHADONE SCREEN, URINE: NOT DETECTED
Opiate, Ur Screen: NOT DETECTED
Phencyclidine (PCP) Ur S: NOT DETECTED
TRICYCLIC, UR SCREEN: NOT DETECTED

## 2015-05-18 LAB — MAGNESIUM: MAGNESIUM: 1.9 mg/dL (ref 1.7–2.4)

## 2015-05-18 LAB — SALICYLATE LEVEL: Salicylate Lvl: <4 mg/dL (ref 2.8–30.0)

## 2015-05-18 LAB — ETHANOL: Alcohol, Ethyl (B): <5 mg/dL (ref <5)

## 2015-05-18 LAB — ACETAMINOPHEN LEVEL: Acetaminophen (Tylenol), Serum: <10 ug/mL — ABNORMAL LOW (ref 10–30)

## 2015-05-18 MED ORDER — AMPHETAMINE-DEXTROAMPHETAMINE 5 MG PO TABS: 30.0000 mg | ORAL_TABLET | Freq: Every day | ORAL | Status: DC

## 2015-05-18 MED ORDER — FLUTICASONE PROPIONATE 50 MCG/ACT NA SUSP
2.0000 | Freq: Every day | NASAL | Status: DC
  Filled 2015-05-18: qty 16

## 2015-05-18 MED ORDER — MELOXICAM 15 MG PO TABS: 15.0000 mg | ORAL_TABLET | Freq: Every day | ORAL | Status: DC

## 2015-05-18 MED ORDER — CLONAZEPAM 0.5 MG PO TABS
0.5000 mg | ORAL_TABLET | Freq: Two times a day (BID) | ORAL | Status: DC
  Administered 2015-05-18 (×2): 0.5 mg via ORAL
  Filled 2015-05-18: qty 1

## 2015-05-18 MED ORDER — TRAZODONE HCL 100 MG PO TABS
100.0000 mg | ORAL_TABLET | Freq: Every day | ORAL | Status: DC
  Administered 2015-05-18: 100 mg via ORAL
  Filled 2015-05-18: qty 1

## 2015-05-18 MED ORDER — CLONAZEPAM 0.5 MG PO TABS
ORAL_TABLET | ORAL | Status: AC
  Administered 2015-05-18: 0.5 mg via ORAL
  Filled 2015-05-18: qty 1

## 2015-05-18 MED ORDER — VENLAFAXINE HCL ER 75 MG PO CP24: 75.0000 mg | ORAL_CAPSULE | Freq: Every day | ORAL | Status: DC

## 2015-05-18 MED ORDER — BUSPIRONE HCL 10 MG PO TABS
10.0000 mg | ORAL_TABLET | Freq: Two times a day (BID) | ORAL | Status: DC
  Administered 2015-05-18 (×2): 10 mg via ORAL
  Filled 2015-05-18: qty 1

## 2015-05-18 MED ORDER — BUSPIRONE HCL 10 MG PO TABS
ORAL_TABLET | ORAL | Status: AC
  Administered 2015-05-18: 10 mg via ORAL
  Filled 2015-05-18: qty 1
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
## 2015-05-18 NOTE — ED Notes (Signed)
Patient remains quiet, depressed. Cooperative with nursing interventions. Patient is aware that he will be hospitalized for continuing treatment. Maintained on all safety precautions.

## 2015-05-18 NOTE — ED Notes (Signed)
Patient visited by chaplain.  Maintained on all safety precautions.
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
## 2015-05-18 NOTE — ED Notes (Signed)
Patient visited with mother. Maintained on 15 minute checks and observation by security camera for safety.

## 2015-05-18 NOTE — ED Notes (Signed)
Patient tolerated po medications well. Calm and cooperative. Will continue to monitor.

## 2015-05-18 NOTE — ED Notes (Signed)
Patient resting quietly in room. No noted distress or abnormal behaviors noted. Will continue 15 minute checks and observation by security camera for safety. 

## 2015-05-18 NOTE — ED Notes (Signed)
Poison control called and requested update on pt.

## 2015-05-18 NOTE — ED Notes (Signed)
Patient asleep in room. No noted distress or abnormal behavior. Will continue 15 minute checks and observation by security cameras for safety. 

## 2015-05-18 NOTE — ED Notes (Signed)
Attempted to call report to inpatient unit. Was told by charge nurse that admission would be delayed until next shift due to staffing and other expected admissions. Will re attempt after 7:00pm.

## 2015-05-18 NOTE — ED Notes (Addendum)
Patient resting quietly in room. No noted distress or abnormal behaviors noted. Will continue 15 minute checks and observation by security camera for safety. 

## 2015-05-18 NOTE — ED Notes (Signed)
BEHAVIORAL HEALTH ROUNDING Patient sleeping: YES Patient alert and oriented: SLEEPING Behavior appropriate: SLEEPING Nutrition and fluids offered: SLEEPING Toileting and hygiene offered: SLEEPING Sitter present: YES Law enforcement present: YES 

## 2015-05-18 NOTE — ED Provider Notes (Signed)
-----------------------------------------   3:00 PM on 05/18/2015 -----------------------------------------   Blood pressure 101/58, pulse 88, temperature 97.8 F (36.6 C), temperature source Oral, resp. rate 18, height 5\' 5"  (1.651 m), weight 244 lb (110.678 kg), SpO2 98 %.  The patient had no acute events since last update.  Calm and cooperative at this time.  Disposition is pending per Psychiatry/Behavioral Medicine team recommendations.     Arnaldo NatalPaul F Karalyne Nusser, MD 05/18/15 1500
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
## 2015-05-18 NOTE — ED Notes (Signed)
Report from Carmin MuskratAmy Harris, RN

## 2015-05-18 NOTE — ED Notes (Signed)
Patient took a shower. In no apparent distress.  Safety checks maintained.

## 2015-05-18 NOTE — Progress Notes (Addendum)
Pt. is to be admitted to [facility] by Dr. Toni Amendlapacs. Attending Physician will be Dr. Jennet MaduroPucilowska.  Pt. has been assigned to room 314, by [facility] Charge Nurse Toniann FailWendy.  Intake Paper Work has been signed and placed on pt. chart. ER staff Rivka Barbara( [facility].; Dr. Darnelle CatalanMalinda, ER MD; Amy Patient's Nurse & Renee Patient Access) have been made aware of the admission.  05/18/2015 Cheryl FlashNicole Bear Osten, MS, NCC, LPCA Therapeutic Triage Specialist

## 2015-05-18 NOTE — ED Notes (Signed)
Patient meeting with TTS.  Maintained on 15 minute checks and observation by security camera for safety. 

## 2015-05-18 NOTE — ED Notes (Signed)

## 2015-05-18 NOTE — ED Notes (Signed)
Report called to Moz[place]

## 2015-05-18 NOTE — ED Notes (Signed)
Pt resting quietly on stretcher in darkened exam room with eyes closed; resp even/unlab with no distress noted 

## 2015-05-18 NOTE — ED Notes (Signed)
Pt uprite on stretcher in darkened exam room with eyes closed; resp even/unlab, no distress noted; card monitor in place

## 2015-05-19 DIAGNOSIS — Z6841 Body Mass Index (BMI) 40.0 and over, adult: Secondary | ICD-10-CM

## 2015-05-19 DIAGNOSIS — R4183 Borderline intellectual functioning: Secondary | ICD-10-CM

## 2015-05-19 DIAGNOSIS — F332 Major depressive disorder, recurrent severe without psychotic features: Principal | ICD-10-CM

## 2015-05-19 DIAGNOSIS — E669 Obesity, unspecified: Secondary | ICD-10-CM

## 2015-05-19 MED ORDER — ALUM & MAG HYDROXIDE-SIMETH 200-200-20 MG/5ML PO SUSP: 30.0000 mL | ORAL | Status: DC | PRN

## 2015-05-19 MED ORDER — FLUTICASONE PROPIONATE 50 MCG/ACT NA SUSP
2.0000 | Freq: Every day | NASAL | Status: DC
  Administered 2015-05-19 – 2015-05-21 (×3): 2 via NASAL
  Filled 2015-05-19 (×2): qty 16

## 2015-05-19 MED ORDER — AMPHETAMINE-DEXTROAMPHETAMINE 5 MG PO TABS
30.0000 mg | ORAL_TABLET | Freq: Every day | ORAL | Status: DC
  Administered 2015-05-19 – 2015-05-21 (×3): 30 mg via ORAL
  Filled 2015-05-19 (×3): qty 6

## 2015-05-19 MED ORDER — ACETAMINOPHEN 325 MG PO TABS: 650.0000 mg | ORAL_TABLET | Freq: Four times a day (QID) | ORAL | Status: DC | PRN

## 2015-05-19 MED ORDER — CLONAZEPAM 0.5 MG PO TABS
0.5000 mg | ORAL_TABLET | Freq: Two times a day (BID) | ORAL | Status: DC
  Administered 2015-05-19: 0.5 mg via ORAL
  Filled 2015-05-19: qty 1

## 2015-05-19 MED ORDER — MAGNESIUM HYDROXIDE 400 MG/5ML PO SUSP
30.0000 mL | Freq: Every day | ORAL | Status: DC | PRN
Start: 2015-05-19 — End: 2015-05-21

## 2015-05-19 MED ORDER — BUSPIRONE HCL 10 MG PO TABS
10.0000 mg | ORAL_TABLET | Freq: Two times a day (BID) | ORAL | Status: DC
  Administered 2015-05-19: 10 mg via ORAL
  Filled 2015-05-19: qty 1

## 2015-05-19 MED ORDER — TRAZODONE HCL 100 MG PO TABS
100.0000 mg | ORAL_TABLET | Freq: Every day | ORAL | Status: DC
  Administered 2015-05-19 – 2015-05-20 (×2): 100 mg via ORAL
  Filled 2015-05-19 (×2): qty 1

## 2015-05-19 MED ORDER — MELOXICAM 7.5 MG PO TABS
15.0000 mg | ORAL_TABLET | Freq: Every day | ORAL | Status: DC
  Administered 2015-05-19 – 2015-05-21 (×3): 15 mg via ORAL
  Filled 2015-05-19 (×4): qty 1
  Filled 2015-05-19: qty 2

## 2015-05-19 MED ORDER — VENLAFAXINE HCL ER 75 MG PO CP24
75.0000 mg | ORAL_CAPSULE | Freq: Every day | ORAL | Status: DC
  Administered 2015-05-19 – 2015-05-21 (×3): 75 mg via ORAL
  Filled 2015-05-19 (×3): qty 1

## 2015-05-19 NOTE — Progress Notes (Signed)
Patient states that he is no longer depressed or suicidal and has resolved to separate from his wife. He said he has spoken with his pastor and is going to follow his advice. Attending groups, showering, taking meds.
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
## 2015-05-19 NOTE — [facility] Group Notes (Signed)
[facility] LCSW Group Therapy  05/19/2015 4:49 PM  Type of Therapy:  Group Therapy  Participation Level:  Active  Participation Quality:  Attentive  Affect:  Depressed  Cognitive:  Alert  Insight:  Improving  Engagement in Therapy:  Improving  Modes of Intervention:  Discussion, Education, Socialization and Support  Summary of Progress/Problems: Emotional Regulation: Patients will identify both negative and positive emotions. They will discuss emotions they have difficulty regulating and how they impact their lives. Patients will be asked to identify healthy coping skills to combat unhealthy reactions to negative emotions.   Pt attended group and stayed the entire time. He dicussed his marriage and how that relationship triggers overwhelming emotions. He believes they need to work out their issues individually before they can start working on being a couple.    Daisy Floroandace L Betty Daidone MSW, LCSWA  05/19/2015, 4:49 PM

## 2015-05-19 NOTE — Progress Notes (Signed)
Patient is 34 year old black male who present to the unit for depression and SI from ED, reports taking an overdose on trazodone after an argument with his wife. Pt present with several stressors spousal issues and unemployment says he felt like he couldn't take it anymore.  Pt is sad and depressed. Poor eye contact. Speech soft. H/x depression and anxiety d/o. Pt brought in his own medications and sent to pharmacy. Pt skin assessment was completed with another nurse. Skin warm, dry and intact. Pt belongings searched with no contraband found. Denies SI/HI/AV/H. No c/o pain/discomfort noted. q 15 min checks maintained for safety. Pt resting quietly in bed. Will continue to monitor for safety and behavior.

## 2015-05-19 NOTE — [facility] Group Notes (Signed)
[facility] Note   05/19/2015 11:38 AM  Participation Quality:  Active  Mood/Affect:  Appropriate  Thoughts of Suicide:  NA Will you contract for safety?   NA  Current AVH:  NA  Plan for Discharge/Comments:  Patient attended and participated in group discussion appropriately. Patient identified that his goal is to speak to the doctor, think about his "next steps and get things under control". He reported wanting to find peace and employment. Patient will return to his mother's residence upon discharge.  Transportation Means: Patient plans to contact his Renato Gailsastor for transportation.  Supports: Patient will schedule [facility]. Mayford KnifeWilliams [phone]. He receives additional support from his Renato Gailsastor and mother.   Jenel LucksJasmine Gutierrez, Clinical Social Work Intern 05/19/15  Derrick MeagerJason Roise Gutierrez, MSW, LCSWA 05/19/15

## 2015-05-19 NOTE — Tx Team (Signed)
Initial Interdisciplinary Treatment Plan   PATIENT STRESSORS: Financial difficulties Marital or family conflict   PATIENT STRENGTHS: Motivation for treatment/growth Supportive family/friends   PROBLEM LIST: Problem List/Patient Goals Date to be addressed Date deferred Reason deferred Estimated date of resolution  Depression 05/19/2015     Suicidal ideation 05/19/2015                                                DISCHARGE CRITERIA:  Improved stabilization in mood, thinking, and/or behavior  PRELIMINARY DISCHARGE PLAN: Return to previous living arrangement  PATIENT/FAMIILY INVOLVEMENT: This treatment plan has been presented to and reviewed with the patient, Barbette Reichmannlliott Mcnutt, and/or family member.  The patient and family have been given the opportunity to ask questions and make suggestions.  Foster Simpsonrina Jhaden Pizzuto 05/19/2015, 12:46 AM

## 2015-05-19 NOTE — Progress Notes (Signed)
Recreation Therapy Notes  Date: 11.16.16 Time: 3:00 pm Location: Craft Room  Group Topic: Self-esteem  Goal Area(s) Addresses:  Patient will write down at least one positive trait. Patient will verbalize benefit of having a healthy self-esteem.  Behavioral Response: Attentive  Intervention: I Am  Activity: Patients were given a worksheet with the letter I on it and instructed to write as many positive traits about themselves as they could inside the letter.  Education: LRT educated patients on ways they can increase their self-esteem.  Education Outcome: Acknowledges education/In group clarification offered  Clinical Observations/Feedback: Patient completed activity by writing positive traits about himself. Patient did not contribute to group discussion.  Jacquelynn CreeGreene,Rebel Willcutt M, LRT/CTRS 05/19/2015 4:29 PM

## 2015-05-19 NOTE — [facility] Group Notes (Signed)
[facility] Group Notes:  (Nursing/MHT/Case Management/Adjunct)  Date:  05/19/2015  Time:  11:55 AM  Type of Therapy:  Psychoeducational Skills  Participation Level:  Active  Participation Quality:  Appropriate, Attentive and Sharing  Affect:  Appropriate  Cognitive:  Alert and Appropriate  Insight:  Appropriate and Good  Engagement in Group:  Engaged  Modes of Intervention:  Discussion, Education and Support  Summary of Progress/Problems:  Lynelle SmokeCara Travis Tymeshia Awan 05/19/2015, 11:55 AM

## 2015-05-19 NOTE — Progress Notes (Signed)
Recreation Therapy Notes  INPATIENT RECREATION THERAPY ASSESSMENT  Patient Details Name: Derrick Gutierrez MRN: [id number] DOB: 15-Apr-1981 Today's Date: 05/19/2015  Patient Stressors: Relationship, Death, Other (Comment) (Marital problems; father passed away in March; searching for a job)  Coping Skills:   Arguments, Avoidance, Exercise, Art/Dance, Talking, Music, Sports, Other (Comment) (Write, read, play video games)  Personal Challenges: Communication, Problem-Solving, Relationships, Stress Management  Leisure Interests (2+):  Games - Video games, Individual - Other (Comment) (Martial art classes)  Awareness of Community Resources:  Yes  Community Resources:  [employer], Other (Comment) (Gaming events)  Current Use: Yes (No to gaming events due to transportation)  If no, Barriers?:    Patient Strengths:  Nice, easy-going  Patient Identified Areas of Improvement:  Stress management  Current Recreation Participation:  [employer], gaming  Patient Goal for Hospitalization:  To work on self  [place]  County [place] Residence:  [place]   Current SI (including self-harm):  No  Current HI:  No  Consent to Intern Participation: N/A   Jacquelynn CreeGreene,Aubrie Lucien M, LRT/CTRS 05/19/2015, 2:56 PM

## 2015-05-19 NOTE — [facility] Suicide Risk Assessment (Signed)
[facility]   Nursing information obtained from:    Demographic factors:    Current Mental Status:    Loss Factors:    Historical Factors:    Risk Reduction Factors:    Total Time spent with patient: 1 hour Principal Problem: Major depressive disorder, recurrent episode, severe with anxious distress (HCC) Diagnosis:   Patient Active Problem List   Diagnosis Date Noted  . Obesity [E66.9] 05/19/2015  . 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 Asthma, mild intermittent [J45.20] 04/12/2015  . Back pain, chronic [M54.9, G89.29] 04/12/2015  . 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 ADHD (attention deficit hyperactivity disorder) [F90.9] 02/08/2015  . Lumbar transverse process fracture (HCC) [S32.008A] 10/19/2014     Continued Clinical Symptoms:  Alcohol Use Disorder Identification Test Final Score (AUDIT): 0 The "Alcohol Use Disorders Identification Test", Guidelines for Use in Primary Care, Second Edition.  World [employer] [facility](WHO). Score between 0-7:  no or low risk or alcohol related problems. Score between 8-15:  moderate risk of alcohol related problems. Score between 16-19:  high risk of alcohol related problems. Score 20 or above:  warrants further diagnostic evaluation for alcohol dependence and treatment.   CLINICAL FACTORS:   Depression:   Impulsivity Chronic Pain More than one psychiatric diagnosis Previous Psychiatric Diagnoses and Treatments Medical Diagnoses and Treatments/Surgeries   Psychiatric Specialty Exam: Physical Exam  ROS  Blood pressure 105/64, pulse 99, temperature 98.2 F (36.8 C), temperature source Oral, resp. rate 18, height 5\' 9"  (1.753 m), weight 111.585 kg (246 lb), SpO2 100 %.Body mass index is 36.31 kg/(m^2).    COGNITIVE FEATURES THAT CONTRIBUTE TO RISK:  None    SUICIDE RISK:   Moderate:  Frequent suicidal ideation with limited intensity,  and duration, some specificity in terms of plans, no associated intent, good self-control, limited dysphoria/symptomatology, some risk factors present, and identifiable protective factors, including available and accessible social support.  PLAN OF CARE: Admit to behavioral health  Medical Decision Making:  Established Problem, Worsening (2)  I certify that inpatient services furnished can reasonably be expected to improve the patient's condition.   Jimmy FootmanHernandez-Gonzalez,  Leiah Giannotti 05/19/2015, 12:56 PM

## 2015-05-20 MED ORDER — BACLOFEN 10 MG PO TABS: 10.0000 mg | ORAL_TABLET | Freq: Three times a day (TID) | ORAL | Status: DC | PRN

## 2015-05-20 NOTE — Plan of Care (Signed)
Problem: Alteration in mood Goal: STG-Patient reports thoughts of self-harm to staff Outcome: Not Applicable Date Met:  67/01/10 Calm and cooperative. Flat affect. No voiced thoughts of hurting himself. No injuries noted. q 15 min checks maintained for safety.

## 2015-05-20 NOTE — Plan of Care (Signed)
Problem: Spiritual Needs Goal: Ability to function at adequate level Outcome: Progressing Pt is maintaining his hygiene, ADLs and going to groups, is independent on the unit and interacts appropriately.   Problem: Diagnosis: Increased Risk For Suicide Attempt Goal: LTG-Patient Will Show Positive Response to Medication LTG (by discharge) : Patient will show positive response to medication and will participate in the development of the discharge plan.  Outcome: Progressing Pt reports that he is feeling much improvement in his mood since his admission and feels ready for d/c tomorrow as per MD.

## 2015-05-20 NOTE — Progress Notes (Signed)
Calm and cooperative. Flat affect. Med compliant. Attends group. No voiced thoughts of hurting himself. No c/o pain/discomfort noted. Slept 8  Hours

## 2015-05-20 NOTE — B[facility] Group Notes (Signed)
B[facility] Group Notes:  (Nursing/MHT/Case Management/Adjunct)  Date:  05/20/2015  Time:  1:51 PM  Type of Therapy:  Psychoeducational Skills  Participation Level:  Active  Participation Quality:  Appropriate  Affect:  Appropriate  Cognitive:  Appropriate  Insight:  Appropriate  Engagement in Group:  Engaged  Modes of Intervention:  Discussion and Education  Summary of Progress/Problems:  [name] 05/20/2015, 1:51 PM

## 2015-05-20 NOTE — [facility] Suicide Risk Assessment (Signed)
[facility]   Demographic Factors:  Male  Total Time spent with patient: 30 minutes  Psychiatric Specialty Exam: Physical Exam  ROS                                                         Have you used any form of tobacco in the last 30 days? (Cigarettes, Smokeless Tobacco, Cigars, and/or Pipes): No  Has this patient used any form of tobacco in the last 30 days? (Cigarettes, Smokeless Tobacco, Cigars, and/or Pipes) No  Mental Status Per Nursing Assessment::   On Admission:     Current Mental Status by Physician: Hopeful, futue oriented, denies SI, HI or hallucinations.  Calm, pleasant and cooperative.   Loss Factors: Loss of significant relationship  Historical Factors: Prior suicide attempts and Impulsivity  Risk Reduction Factors:   Sense of responsibility to family and Positive social support  Continued Clinical Symptoms:  Depression:   Impulsivity Chronic Pain Previous Psychiatric Diagnoses and Treatments  Cognitive Features That Contribute To Risk:  Closed-mindedness    Suicide Risk:  Minimal: No identifiable suicidal ideation.  Patients presenting with no risk factors but with morbid ruminations; may be classified as minimal risk based on the severity of the depressive symptoms  Principal Problem: Major depressive disorder, recurrent episode, severe with anxious distress [facility](HCC) Discharge Diagnoses:  Patient Active Problem List   Diagnosis Date Noted  . Obesity [E66.9] 05/19/2015  . 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 Asthma, mild intermittent [J45.20] 04/12/2015  . Back pain, chronic [M54.9, G89.29] 04/12/2015  . 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 ADHD (attention deficit hyperactivity disorder) [F90.9] 02/08/2015      Is patient on  multiple antipsychotic therapies at discharge:  No   Has Patient had three or more failed trials of antipsychotic monotherapy by history:  No  Recommended Plan for Multiple Antipsychotic Therapies: NA    Jimmy FootmanHernandez-Gonzalez,  Penelopi Mikrut 05/21/2015, 9:21 AM

## 2015-05-20 NOTE — Plan of Care (Signed)
Problem: [facility] Participation in Recreation Therapeutic Interventions Goal: STG-Other Recreation Therapy Goal (Specify) STG: Stress Management - Within 4 treatment sessions, patient will verbalize understanding of the stress management techniques in each of 2 treatment sessions to increase stress management skills post d/c.  Outcome: Progressing Treatment Session 1; Completed 1 out of 2: At approximately 10:05 am, LRT met with patient in craft room. LRT educated and provided patient with handouts on stress management techniques. Patient verbalized understanding. LRT encouraged patient to read over and practice the stress management techniques. Intervention Used: Mindfulness and Progressive Muscle Relaxation handouts  Leonette Monarch, LRT/CTRS 11.17.16 12:18 pm

## 2015-05-20 NOTE — Progress Notes (Signed)
D:  Pt reports improvement in his mood, denies SI/HI/AVH, pleasant and appropriate during interaction, looking forward to d/c tomorrow.  A:  Emotional support provided, Encouraged pt to continue with treatment plan and attend all group activities, q15 min checks maintained for safety.  R:  Pt is receptive, going to groups, is aggreable to aftercare treatment and would like to go to a support group after discharge, SW aware and to give patient resources, pleasant and cooperative with staff and other patients on the unit, going to groups and med compliant.

## 2015-05-20 NOTE — Tx Team (Signed)
Interdisciplinary Treatment Plan Update (Adult)  Date:  05/20/2015 Time Reviewed:  10:37 AM  Progress in Treatment: Attending groups: Yes. Participating in groups:  Yes. Taking medication as prescribed:  Yes. Tolerating medication:  Yes. Family/Significant othe contact made:  No, will contact:  wife Patient understands diagnosis:  Yes. Discussing patient identified problems/goals with staff:  Yes. Medical problems stabilized or resolved:  Yes. Denies suicidal/homicidal ideation: Yes. Issues/concerns per patient self-inventory:  Yes. Other:  New problem(s) identified: No, Describe:  NA  Discharge Plan or Barriers: Pt plans to return home and follow up with outpatient.    Reason for Continuation of Hospitalization: Anxiety Depression Medication stabilization  Comments:Patient is a 34 year old married male with history of depression and anxiety, ADHD and chronic pain who presented to our emergency department after an overdose on a handful of trazodone. The patient reports that he has been married with his wife for a year and a half. He describes the relationship as very stressful as they argue frequently. He stated that prior to the overdose that were arguing because his wife was telling him he was spending too much time in the computer and in the television instead of spending it with her. 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 The patient is states he took the handful of trazodone in an attempt to calm down but not to kill himself. However when his wife found out he overdosed she called 911. The patient states he feels better now and wants to be discharged. He plans to separate his wife for some time and in the meantime he plans to move in with his mother. Patient is denying suicidality, homicidality or having auditory or visual hallucinations. Per records reviewed  this patient was hospitalized in our facility a month ago due to depression and anxiety. He receives psychiatric care at [facility] where he is prescribed with Effexor 75 mg a day, Adderall XR 30 mg a day, clonazepam 0.5 mg by mouth twice a day, BuSpar 10 mg by mouth twice a day and trazodone 100 mg by mouth daily at bedtime. Patient states he has been compliant with his regimen.  Estimated length of stay: pt will likely d/c tomorrow.   New goal(s):  Review of initial/current patient goals per problem list:   1.  Goal(s): Patient will participate in aftercare plan * Met:  * Target date: at discharge * As evidenced by: Patient will participate within aftercare plan AEB aftercare provider and housing plan at discharge being identified.   2.  Goal (s): Patient will exhibit decreased depressive symptoms and suicidal ideations. * Met:  *  Target date: at discharge * As evidenced by: Patient will utilize self rating of depression at 3 or below and demonstrate decreased signs of depression or be deemed stable for discharge by MD.   3.  Goal(s): Patient will demonstrate decreased signs and symptoms of anxiety. * Met:  * Target date: at discharge * As evidenced by: Patient will utilize self rating of anxiety at 3 or below and demonstrated decreased signs of anxiety, or be deemed stable for discharge by MD    Attendees: Patient:  Derrick Gutierrez 11/17/201610:37 AM  Family:   11/17/201610:37 AM  Physician: Dr. Jerilee Hoh    11/17/201610:37 AM  Nursing:    11/17/201610:37 AM  Case Manager:   11/17/201610:37 AM  Counselor:   11/17/201610:37 AM  Other:  Hal Hope  Memory Dance, [place] 11/17/201610:37 AM  Other:  Everitt Amber, LRT  11/17/201610:37 AM  Other:   11/17/201610:37 AM  Other:  11/17/201610:37 AM  Other:  11/17/201610:37 AM  Other:  11/17/201610:37 AM  Other:  11/17/201610:37 AM  Other:  11/17/201610:37 AM  Other:  11/17/201610:37 AM  Other:   11/17/201610:37 AM   Scribe for Treatment  Team:   Wray Kearns MSW, LCSWA , 05/20/2015, 10:37 AM
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
## 2015-05-20 NOTE — Progress Notes (Signed)
Recreation Therapy Notes  Date: 11.17.16 Time: 3:00 pm Location: Craft Room  Group Topic: Leisure Education  Goal Area(s) Addresses:  Patient will identify activities for each letter of the alphabet. Patient will verbalize ability to integrate positive leisure into life post d/c. Patient will verbalize ability to use leisure as a Associate Professorcoping skill.  Behavioral Response: Attentive, Interactive  Intervention: Leisure Alphabet  Activity: Patients were given a Leisure Information systems managerAlphabet worksheet and instructed to write healthy leisure activities for each letter of the alphabet.  Education: LRT educated patients on what is needed to participate in leisure.  Education Outcome: Acknowledges education/In group clarification offered  Clinical Observations/Feedback: Patient completed activity by writing down healthy leisure activities. Patient contributed to group discussion by stating some of the healthy leisure activities he wrote down, how he can integrate leisure into his schedule, and why it is important to implement it into his schedule.  Jacquelynn CreeGreene,Elizabeth M, LRT/CTRS 05/20/2015 4:24 PM
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
## 2015-05-21 ENCOUNTER — Ambulatory Visit: Payer: Medicare Other | Admitting: Licensed Clinical Social Worker

## 2015-05-21 NOTE — Progress Notes (Signed)
D: Pt in room upon approach. Pt is pleasant and cooperative with prescribed medications. Denies SI/HI/AVH. Denies pain. Pt reports that he is looking forward to d/c tomorrow and has appointments scheduled after d/c. A: Emotional support provided. q15 minute safety checks maintained. R: Pt remains free from harm.

## 2015-05-21 NOTE — Progress Notes (Signed)
Recreation Therapy Notes  INPATIENT RECREATION TR PLAN  Patient Details Name: Timm Bonenberger MRN: [id number] DOB: 08-03-1980 Today's Date: 05/21/2015  Rec Therapy Plan Is patient appropriate for Therapeutic Recreation?: Yes Treatment times per week: At least once a week TR Treatment/Interventions: 1:1 session, Group participation (Comment) (Appropriate participation in daily recreation therapy tx)  Discharge Criteria Pt will be discharged from therapy if:: Treatment goals are met, Discharged Treatment plan/goals/alternatives discussed and agreed upon by:: Patient/family  Discharge Summary Short term goals set: See Care Plan Short term goals met: Complete Progress toward goals comments: One-to-one attended Which groups?: Leisure education, Self-esteem One-to-one attended: Stress Managment Reason goals not met: N/a Therapeutic equipment acquired: None Reason patient discharged from therapy: Discharge from hospital Pt/family agrees with progress & goals achieved: Yes Date patient discharged from therapy: 05/21/15   Leonette Monarch, LRT/CTRS 05/21/2015, 2:49 PM

## 2015-05-21 NOTE — Plan of Care (Signed)
Problem: [facility] Participation in Recreation Therapeutic Interventions Goal: STG-Other Recreation Therapy Goal (Specify) STG: Stress Management - Within 4 treatment sessions, patient will verbalize understanding of the stress management techniques in each of 2 treatment sessions to increase stress management skills post d/c.  Outcome: Completed/Met Date Met:  05/21/15 Treatment Session 2; Completed 2 out of 2: At approximately 12:10 pm, LRT met with patient in craft room. Patient reported he read over and practiced the stress management techniques. Patient verbalized understanding. LRT encouraged patient to continue practicing the stress management techniques. Intervention Used: Mindfulness and Progressive Muscle Relaxation handouts  Leonette Monarch, LRT/CTRS 11.18.16 2:48 pm

## 2015-05-21 NOTE — [facility] Suicide Risk Assessment (Signed)
[facility] INPATIENT:  Family/Significant Other Suicide Prevention Education  Suicide Prevention Education:  Education Completed: Derrick Gutierrez (Mother) [phone] has been identified by the patient as the family member/significant other with whom the patient will be residing, and identified as the person(s) who will aid the patient in the event of a mental health crisis (suicidal ideations/suicide attempt).  With written consent from the patient, the family member/significant other has been provided the following suicide prevention education, prior to the and/or following the discharge of the patient.  The suicide prevention education provided includes the following:  Suicide risk factors  Suicide prevention and interventions  National Suicide Hotline telephone number  [facility] assessment telephone number  [facility] Emergency Assistance 911  [facility] and/or Residential Mobile Crisis Unit telephone number  Request made of family/significant other to:  Remove weapons (e.g., guns, rifles, knives), all items previously/currently identified as safety concern.    Remove drugs/medications (over-the-counter, prescriptions, illicit drugs), all items previously/currently identified as a safety concern.  The family member/significant other verbalizes understanding of the suicide prevention education information provided.  The family member/significant other agrees to remove the items of safety concern listed above.  Derrick Gutierrez MSW, LCSWA  05/21/2015, 1:21 PM

## 2015-05-21 NOTE — Progress Notes (Signed)
  [facility] :  Will you be returning to the same living situation after discharge:  Yes,  Home  At discharge, do you have transportation home?: Yes,  Self  Do you have the ability to pay for your medications: Yes,  Insurance, income   Release of information consent forms completed and in the chart;  Patient's signature needed at discharge.  Patient to Follow up at: Follow-up Information    Follow up with [facility] . Go on 05/21/2015.   Why:  Your hospital follow up appointment is with Dr. Wallace GoingAlton Williams at 3:00pm.    Contact information:   [address]  [facility] [place]  Phone: [phone]      Follow up with [facility]  On 06/03/2015.   Why:  You have an appointment with your therapist on Thursday, Dec. 1st at 1:00pm.    Contact information:   [address] [place], [place] [address] Phone:[phone] or [phone] Fax:[phone]       Next level of care provider has access to [facility] Link:no  Patient denies SI/HI: Yes,  Yes    Safety Planning and Suicide Prevention discussed: Yes,  With patient and mother   Have you used any form of tobacco in the last 30 days? (Cigarettes, Smokeless Tobacco, Cigars, and/or Pipes): No  Has patient been referred to the Quitline?: N/A patient is not a smoker  Sempra EnergyCandace L Peyson Gutierrez MSW, LCSWA  05/21/2015, 4:45 PM

## 2015-05-21 NOTE — Plan of Care (Signed)
Problem: Diagnosis: Increased Risk For Suicide Attempt Goal: LTG-Patient Will Show Positive Response to Medication LTG (by discharge) : Patient will show positive response to medication and will participate in the development of the discharge plan.  Outcome: Progressing Pt denies SI/HI/AVH Goal: STG-Patient Will Comply With Medication Regime Outcome: Progressing Pt takes medications as prescribed

## 2015-05-21 NOTE — [facility] Group Notes (Signed)
[facility] Group Notes:  (Nursing/MHT/Case Management/Adjunct)  Date:  05/21/2015  Time:  1:40 PM  Type of Therapy:  Psychoeducational Skills  Participation Level:  Active  Participation Quality:  Appropriate, Attentive and Sharing  Affect:  Appropriate  Cognitive:  Alert and Appropriate  Insight:  Appropriate and Good  Engagement in Group:  Engaged  Modes of Intervention:  Discussion, Education and Support  Summary of Progress/Problems:  Lynelle SmokeCara Travis Saige Busby 05/21/2015, 1:40 PM
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
## 2015-05-21 NOTE — Progress Notes (Signed)
D/C instructions/meds/follow-up appointments reviewed, pt verbalized understanding, pt's belongings returned to pt, denies SI/HI/AVH. 

## 2015-05-26 ENCOUNTER — Ambulatory Visit: Payer: Medicaid Other | Admitting: Psychiatry

## 2015-06-03 DIAGNOSIS — F332 Major depressive disorder, recurrent severe without psychotic features: Secondary | ICD-10-CM | POA: Diagnosis not present

## 2015-06-07 ENCOUNTER — Encounter: Payer: Medicare Other | Admitting: Family Medicine

## 2015-06-09 ENCOUNTER — Other Ambulatory Visit: Payer: Self-pay | Admitting: Psychiatry

## 2015-06-09 DIAGNOSIS — S334XXD Traumatic rupture of symphysis pubis, subsequent encounter: Secondary | ICD-10-CM | POA: Diagnosis not present

## 2015-06-09 DIAGNOSIS — S32811D Multiple fractures of pelvis with unstable disruption of pelvic ring, subsequent encounter for fracture with routine healing: Secondary | ICD-10-CM | POA: Diagnosis not present

## 2015-06-16 ENCOUNTER — Encounter: Payer: Self-pay | Admitting: Family Medicine

## 2015-06-16 ENCOUNTER — Ambulatory Visit (INDEPENDENT_AMBULATORY_CARE_PROVIDER_SITE_OTHER): Payer: Medicare Other | Admitting: Family Medicine

## 2015-06-16 VITALS — BP 122/83 | HR 121 | Temp 98.1°F | Resp 16 | Ht 69.0 in | Wt 260.0 lb

## 2015-06-16 DIAGNOSIS — J069 Acute upper respiratory infection, unspecified: Secondary | ICD-10-CM | POA: Insufficient documentation

## 2015-06-16 DIAGNOSIS — F332 Major depressive disorder, recurrent severe without psychotic features: Secondary | ICD-10-CM | POA: Diagnosis not present

## 2015-06-16 DIAGNOSIS — J40 Bronchitis, not specified as acute or chronic: Secondary | ICD-10-CM | POA: Diagnosis not present

## 2015-06-16 MED ORDER — LORATADINE 10 MG PO TABS
10.0000 mg | ORAL_TABLET | Freq: Every day | ORAL | Status: DC
Start: 2015-06-16 — End: 2015-08-10

## 2015-06-16 MED ORDER — VENLAFAXINE HCL ER 75 MG PO CP24: 75.0000 mg | ORAL_CAPSULE | Freq: Every morning | ORAL | Status: DC

## 2015-06-16 MED ORDER — AZITHROMYCIN 250 MG PO TABS: ORAL_TABLET | ORAL | Status: DC

## 2015-06-16 MED ORDER — DM-GUAIFENESIN ER 30-600 MG PO TB12: 1.0000 | ORAL_TABLET | Freq: Two times a day (BID) | ORAL | Status: DC

## 2015-06-16 MED ORDER — ALBUTEROL SULFATE HFA 108 (90 BASE) MCG/ACT IN AERS: 2.0000 | INHALATION_SPRAY | Freq: Four times a day (QID) | RESPIRATORY_TRACT | Status: DC | PRN

## 2015-06-16 NOTE — Patient Instructions (Signed)
Rest and drink plenty of fluids.

## 2015-06-16 NOTE — Progress Notes (Signed)
Name: Derrick Gutierrez   MRN: [id number]    DOB: [birth year]   Date:06/16/2015       Progress Note  Subjective  Chief Complaint  Chief Complaint  Patient presents with  . Nasal Congestion    sore throat 1.5 weeks.  . Cough    thick white mucus    HPI Sick x about 10 days with cold and cough.  Was getting better, then cough worsened with sore throat.  No fever of chills.  Deep cough with wheezing.  Cough productive of white thick sputum  No problem-specific assessment & plan notes found for this encounter.   Past Medical History  Diagnosis Date  . Asthma   . Anxiety   . Depression   . Seizures ([place])   . ADHD (attention deficit hyperactivity disorder)     Social History  Substance Use Topics  . Smoking status: Never Smoker   . Smokeless tobacco: Never Used  . Alcohol Use: No     Current outpatient prescriptions:  .  amphetamine-dextroamphetamine (ADDERALL XR) 30 MG 24 hr capsule, Take 30 mg by mouth daily., Disp: , Rfl:  .  baclofen (LIORESAL) 10 MG tablet, Take 1 tablet (10 mg total) by mouth 3 (three) times daily as needed for muscle spasms., Disp: 30 each, Rfl: 0 .  meloxicam (MOBIC) 15 MG tablet, Take 1 tablet (15 mg total) by mouth daily as needed (Moderate pain)., Disp: 30 tablet, Rfl: 0 .  traZODone (DESYREL) 100 MG tablet, Take 1 tablet (100 mg total) by mouth at bedtime as needed for sleep., Disp: 30 tablet, Rfl: 0 .  venlafaxine XR (EFFEXOR-XR) 75 MG 24 hr capsule, Take 1 capsule (75 mg total) by mouth every morning., Disp: 30 capsule, Rfl: 0 .  fluticasone (FLONASE) 50 MCG/ACT nasal spray, Place 2 sprays into both nostrils daily., Disp: , Rfl: 12  Not on File  Review of Systems  Constitutional: Positive for malaise/fatigue. Negative for fever, chills and weight loss.  Respiratory: Positive for cough, sputum production, shortness of breath and wheezing.   Cardiovascular: Negative for chest pain, palpitations and leg swelling.  Gastrointestinal: Negative for  heartburn, abdominal pain and blood in stool.  Genitourinary: Negative for dysuria, urgency and frequency.  Neurological: Negative for weakness and headaches.  Psychiatric/Behavioral: Positive for depression.      Objective  Filed Vitals:   06/16/15 1550  BP: 122/83  Pulse: 121  Temp: 98.1 F (36.7 C)  TempSrc: Oral  Resp: 16  Height: _0  (1.753 m)  Weight: 260 lb (117.935 kg)  SpO2: 98%     Physical Exam  Constitutional: He appears distressed (Appears to feel ill.).  HENT:  Head: Normocephalic and atraumatic.  Right Ear: External ear normal.  Left Ear: External ear normal.  Nose: Mucosal edema and rhinorrhea present. Right sinus exhibits no maxillary sinus tenderness and no frontal sinus tenderness. Left sinus exhibits no maxillary sinus tenderness and no frontal sinus tenderness.  Mouth/Throat: Oropharynx is clear and moist.  Cardiovascular: Regular rhythm and normal heart sounds.  Tachycardia present.   Pulmonary/Chest: Effort normal and breath sounds normal. No respiratory distress. He has no wheezes. He has no rales.  Deep severe cough.  Coarse breath sounds throughout.      Recent Results (from the past 2160 hour(s))  Troponin I     Status: None   Collection Time: 04/30/15  4:56 PM  Result Value Ref Range   Troponin I <0.03 <0.031 ng/mL    Comment:  NO INDICATION OF MYOCARDIAL INJURY.   Urine Drug Screen, Qualitative ([place] only)     Status: Abnormal   Collection Time: 04/30/15  4:56 PM  Result Value Ref Range   Tricyclic, Ur Screen NONE DETECTED NONE DETECTED   Amphetamines, Ur Screen POSITIVE (A) NONE DETECTED   MDMA (Ecstasy)Ur Screen NONE DETECTED NONE DETECTED   Cocaine Metabolite,Ur [place] NONE DETECTED NONE DETECTED   Opiate, Ur Screen NONE DETECTED NONE DETECTED   Phencyclidine (PCP) Ur S NONE DETECTED NONE DETECTED   Cannabinoid 50 Ng, Ur [place] NONE DETECTED NONE DETECTED   Barbiturates, Ur Screen NONE DETECTED NONE DETECTED   Benzodiazepine,  Ur Scrn NONE DETECTED NONE DETECTED   Methadone Scn, Ur NONE DETECTED NONE DETECTED    Comment: (NOTE) 287  Tricyclics, urine               Cutoff 1000 ng/mL 200  Amphetamines, urine             Cutoff 1000 ng/mL 300  MDMA (Ecstasy), urine           Cutoff 500 ng/mL 400  Cocaine Metabolite, urine       Cutoff 300 ng/mL 500  Opiate, urine                   Cutoff 300 ng/mL 600  Phencyclidine (PCP), urine      Cutoff 25 ng/mL 700  Cannabinoid, urine              Cutoff 50 ng/mL 800  Barbiturates, urine             Cutoff 200 ng/mL 900  Benzodiazepine, urine           Cutoff 200 ng/mL 1000 Methadone, urine                Cutoff 300 ng/mL 1100 1200 The urine drug screen provides only a preliminary, unconfirmed 1300 analytical test result and should not be used for non-medical 1400 purposes. Clinical consideration and professional judgment should 1500 be applied to any positive drug screen result due to possible 1600 interfering substances. A more specific alternate chemical method 1700 must be used in order to obtain a confirmed analytical result.  1800 Gas chromato graphy / mass spectrometry (GC/MS) is the preferred 1900 confirmatory method.   Ethanol     Status: None   Collection Time: 04/30/15  4:56 PM  Result Value Ref Range   Alcohol, Ethyl (B) <5 <5 mg/dL    Comment:        LOWEST DETECTABLE LIMIT FOR SERUM ALCOHOL IS 5 mg/dL FOR MEDICAL PURPOSES ONLY   Acetaminophen level     Status: Abnormal   Collection Time: 04/30/15  4:56 PM  Result Value Ref Range   Acetaminophen (Tylenol), Serum <10 (L) 10 - 30 ug/mL    Comment:        THERAPEUTIC CONCENTRATIONS VARY SIGNIFICANTLY. A RANGE OF 10-30 ug/mL MAY BE AN EFFECTIVE CONCENTRATION FOR MANY PATIENTS. HOWEVER, SOME ARE BEST TREATED AT CONCENTRATIONS OUTSIDE THIS RANGE. ACETAMINOPHEN CONCENTRATIONS >150 ug/mL AT 4 HOURS AFTER INGESTION AND >50 ug/mL AT 12 HOURS AFTER INGESTION ARE OFTEN ASSOCIATED WITH TOXIC REACTIONS.    Salicylate level     Status: None   Collection Time: 04/30/15  4:56 PM  Result Value Ref Range   Salicylate Lvl <8.6 2.8 - 30.0 mg/dL  Comprehensive metabolic panel     Status: Abnormal   Collection Time: 04/30/15  4:56 PM  Result Value Ref  Range   Sodium 138 135 - 145 mmol/L   Potassium 3.7 3.5 - 5.1 mmol/L   Chloride 104 101 - 111 mmol/L   CO2 26 22 - 32 mmol/L   Glucose, Bld 88 65 - 99 mg/dL   BUN 12 6 - 20 mg/dL   Creatinine, Ser 0.93 0.61 - 1.24 mg/dL   Calcium 9.5 8.9 - 10.3 mg/dL   Total Protein 8.0 6.5 - 8.1 g/dL   Albumin 4.1 3.5 - 5.0 g/dL   AST 18 15 - 41 U/L   ALT 10 (L) 17 - 63 U/L   Alkaline Phosphatase 99 38 - 126 U/L   Total Bilirubin 0.7 0.3 - 1.2 mg/dL   GFR calc non Af Amer >60 >60 mL/min   GFR calc Af Amer >60 >60 mL/min    Comment: (NOTE) The eGFR has been calculated using the CKD EPI equation. This calculation has not been validated in all clinical situations. eGFR's persistently <60 mL/min signify possible Chronic Kidney Disease.    Anion gap 8 5 - 15  CBC with Differential     Status: None   Collection Time: 04/30/15  4:56 PM  Result Value Ref Range   WBC 8.8 3.8 - 10.6 K/uL   RBC 4.71 4.40 - 5.90 MIL/uL   Hemoglobin 14.3 13.0 - 18.0 g/dL   HCT 42.6 40.0 - 52.0 %   MCV 90.3 80.0 - 100.0 fL   MCH 30.3 26.0 - 34.0 pg   MCHC 33.5 32.0 - 36.0 g/dL   RDW 13.3 11.5 - 14.5 %   Platelets 270 150 - 440 K/uL   Neutrophils Relative % 68 %   Neutro Abs 6.0 1.4 - 6.5 K/uL   Lymphocytes Relative 26 %   Lymphs Abs 2.3 1.0 - 3.6 K/uL   Monocytes Relative 5 %   Monocytes Absolute 0.4 0.2 - 1.0 K/uL   Eosinophils Relative 0 %   Eosinophils Absolute 0.0 0 - 0.7 K/uL   Basophils Relative 1 %   Basophils Absolute 0.1 0 - 0.1 K/uL  Hemoglobin A1c     Status: None   Collection Time: 04/30/15  4:56 PM  Result Value Ref Range   Hgb A1c MFr Bld 5.3 4.0 - 6.0 %  Lipid panel, fasting     Status: Abnormal   Collection Time: 04/30/15  4:56 PM  Result Value Ref  Range   Cholesterol 162 0 - 200 mg/dL   Triglycerides 53 <150 mg/dL   HDL 40 (L) >40 mg/dL   Total CHOL/HDL Ratio 4.1 RATIO   VLDL 11 0 - 40 mg/dL   LDL Cholesterol 111 (H) 0 - 99 mg/dL    Comment:        Total Cholesterol/HDL:CHD Risk Coronary Heart Disease Risk Table                     Men   Women  1/2 Average Risk   3.4   3.3  Average Risk       5.0   4.4  2 X Average Risk   9.6   7.1  3 X Average Risk  23.4   11.0        Use the calculated Patient Ratio above and the CHD Risk Table to determine the patient's CHD Risk.        ATP III CLASSIFICATION (LDL):  <100     mg/dL   Optimal  100-129  mg/dL   Near or Above  Optimal  130-159  mg/dL   Borderline  160-189  mg/dL   High  >190     mg/dL   Very High   TSH     Status: None   Collection Time: 04/30/15  4:56 PM  Result Value Ref Range   TSH 1.707 0.350 - 4.500 uIU/mL  Comprehensive metabolic panel     Status: Abnormal   Collection Time: 05/17/15 10:12 PM  Result Value Ref Range   Sodium 140 135 - 145 mmol/L   Potassium 3.4 (L) 3.5 - 5.1 mmol/L   Chloride 107 101 - 111 mmol/L   CO2 26 22 - 32 mmol/L   Glucose, Bld 107 (H) 65 - 99 mg/dL   BUN 14 6 - 20 mg/dL   Creatinine, Ser 1.05 0.61 - 1.24 mg/dL   Calcium 9.1 8.9 - 10.3 mg/dL   Total Protein 8.2 (H) 6.5 - 8.1 g/dL   Albumin 4.3 3.5 - 5.0 g/dL   AST 17 15 - 41 U/L   ALT 11 (L) 17 - 63 U/L   Alkaline Phosphatase 111 38 - 126 U/L   Total Bilirubin 0.6 0.3 - 1.2 mg/dL   GFR calc non Af Amer >60 >60 mL/min   GFR calc Af Amer >60 >60 mL/min    Comment: (NOTE) The eGFR has been calculated using the CKD EPI equation. This calculation has not been validated in all clinical situations. eGFR's persistently <60 mL/min signify possible Chronic Kidney Disease.    Anion gap 7 5 - 15  Ethanol (ETOH)     Status: None   Collection Time: 05/17/15 10:12 PM  Result Value Ref Range   Alcohol, Ethyl (B) <5 <5 mg/dL    Comment:        LOWEST DETECTABLE LIMIT  FOR SERUM ALCOHOL IS 5 mg/dL FOR MEDICAL PURPOSES ONLY   Salicylate level     Status: None   Collection Time: 05/17/15 10:12 PM  Result Value Ref Range   Salicylate Lvl <9.4 2.8 - 30.0 mg/dL  Acetaminophen level     Status: Abnormal   Collection Time: 05/17/15 10:12 PM  Result Value Ref Range   Acetaminophen (Tylenol), Serum <10 (L) 10 - 30 ug/mL    Comment:        THERAPEUTIC CONCENTRATIONS VARY SIGNIFICANTLY. A RANGE OF 10-30 ug/mL MAY BE AN EFFECTIVE CONCENTRATION FOR MANY PATIENTS. HOWEVER, SOME ARE BEST TREATED AT CONCENTRATIONS OUTSIDE THIS RANGE. ACETAMINOPHEN CONCENTRATIONS >150 ug/mL AT 4 HOURS AFTER INGESTION AND >50 ug/mL AT 12 HOURS AFTER INGESTION ARE OFTEN ASSOCIATED WITH TOXIC REACTIONS.   CBC     Status: None   Collection Time: 05/17/15 10:12 PM  Result Value Ref Range   WBC 9.5 3.8 - 10.6 K/uL   RBC 4.85 4.40 - 5.90 MIL/uL   Hemoglobin 14.6 13.0 - 18.0 g/dL   HCT 44.0 40.0 - 52.0 %   MCV 90.7 80.0 - 100.0 fL   MCH 30.2 26.0 - 34.0 pg   MCHC 33.3 32.0 - 36.0 g/dL   RDW 13.3 11.5 - 14.5 %   Platelets 256 150 - 440 K/uL  Urine Drug Screen, Qualitative ([facility] only)     Status: Abnormal   Collection Time: 05/17/15 10:12 PM  Result Value Ref Range   Tricyclic, Ur Screen NONE DETECTED NONE DETECTED   Amphetamines, Ur Screen POSITIVE (A) NONE DETECTED   MDMA (Ecstasy)Ur Screen NONE DETECTED NONE DETECTED   Cocaine Metabolite,Ur  NONE DETECTED NONE DETECTED   Opiate, Ur Screen NONE DETECTED  NONE DETECTED   Phencyclidine (PCP) Ur S NONE DETECTED NONE DETECTED   Cannabinoid 50 Ng, Ur [place] NONE DETECTED NONE DETECTED   Barbiturates, Ur Screen NONE DETECTED NONE DETECTED   Benzodiazepine, Ur Scrn POSITIVE (A) NONE DETECTED   Methadone Scn, Ur NONE DETECTED NONE DETECTED    Comment: (NOTE) 443  Tricyclics, urine               Cutoff 1000 ng/mL 200  Amphetamines, urine             Cutoff 1000 ng/mL 300  MDMA (Ecstasy), urine           Cutoff 500 ng/mL 400   Cocaine Metabolite, urine       Cutoff 300 ng/mL 500  Opiate, urine                   Cutoff 300 ng/mL 600  Phencyclidine (PCP), urine      Cutoff 25 ng/mL 700  Cannabinoid, urine              Cutoff 50 ng/mL 800  Barbiturates, urine             Cutoff 200 ng/mL 900  Benzodiazepine, urine           Cutoff 200 ng/mL 1000 Methadone, urine                Cutoff 300 ng/mL 1100 1200 The urine drug screen provides only a preliminary, unconfirmed 1300 analytical test result and should not be used for non-medical 1400 purposes. Clinical consideration and professional judgment should 1500 be applied to any positive drug screen result due to possible 1600 interfering substances. A more specific alternate chemical method 1700 must be used in order to obtain a confirmed analytical result.  1800 Gas chromato graphy / mass spectrometry (GC/MS) is the preferred 1900 confirmatory method.   Magnesium     Status: None   Collection Time: 05/17/15 10:12 PM  Result Value Ref Range   Magnesium 1.9 1.7 - 2.4 mg/dL  Glucose, capillary     Status: None   Collection Time: 05/17/15 10:25 PM  Result Value Ref Range   Glucose-Capillary 96 65 - 99 mg/dL     Assessment & Plan  1. Bronchitis  - azithromycin (ZITHROMAX) 250 MG tablet; Take 2 tabs on day 1, then 1 tab on days 2-5  Dispense: 6 tablet; Refill: 0 - albuterol (PROVENTIL HFA;VENTOLIN HFA) 108 (90 BASE) MCG/ACT inhaler; Inhale 2 puffs into the lungs every 6 (six) hours as needed for wheezing or shortness of breath.  Dispense: 1 Inhaler; Refill: 3 - dextromethorphan-guaiFENesin (MUCINEX DM) 30-600 MG 12hr tablet; Take 1 tablet by mouth 2 (two) times daily.  Dispense: 30 tablet; Refill: 0  2. Major depressive disorder, recurrent episode, severe with anxious distress (HCC)  - venlafaxine XR (EFFEXOR-XR) 75 MG 24 hr capsule; Take 1 capsule (75 mg total) by mouth every morning.  Dispense: 30 capsule; Refill: 0  3. Upper respiratory infection  -  loratadine (CLARITIN) 10 MG tablet; Take 1 tablet (10 mg total) by mouth daily.  Dispense: 30 tablet; Refill: 11

## 2015-06-29 ENCOUNTER — Ambulatory Visit: Payer: Self-pay | Admitting: Family Medicine

## 2015-06-30 DIAGNOSIS — Z1389 Encounter for screening for other disorder: Secondary | ICD-10-CM | POA: Diagnosis not present

## 2015-06-30 DIAGNOSIS — F418 Other specified anxiety disorders: Secondary | ICD-10-CM | POA: Diagnosis not present

## 2015-06-30 DIAGNOSIS — Z8659 Personal history of other mental and behavioral disorders: Secondary | ICD-10-CM | POA: Diagnosis not present

## 2015-07-06 ENCOUNTER — Ambulatory Visit
Admission: RE | Admit: 2015-07-06 | Discharge: 2015-07-06 | Disposition: A | Discharge status: Home / Self Care | Payer: Medicare Other | Source: Ambulatory Visit | Attending: Family Medicine | Admitting: Family Medicine

## 2015-07-06 ENCOUNTER — Encounter: Payer: Self-pay | Admitting: Family Medicine

## 2015-07-06 ENCOUNTER — Ambulatory Visit (INDEPENDENT_AMBULATORY_CARE_PROVIDER_SITE_OTHER): Payer: Medicare Other | Admitting: Family Medicine

## 2015-07-06 VITALS — BP 133/85 | HR 88 | Temp 98.4°F | Resp 16 | Ht 69.0 in | Wt 259.0 lb

## 2015-07-06 DIAGNOSIS — R05 Cough: Secondary | ICD-10-CM

## 2015-07-06 DIAGNOSIS — J4521 Mild intermittent asthma with (acute) exacerbation: Secondary | ICD-10-CM | POA: Diagnosis not present

## 2015-07-06 DIAGNOSIS — R059 Cough, unspecified: Secondary | ICD-10-CM

## 2015-07-06 DIAGNOSIS — J40 Bronchitis, not specified as acute or chronic: Secondary | ICD-10-CM | POA: Diagnosis not present

## 2015-07-06 MED ORDER — PREDNISONE 20 MG PO TABS: 40.0000 mg | ORAL_TABLET | Freq: Every day | ORAL | Status: DC

## 2015-07-06 MED ORDER — BENZONATATE 100 MG PO CAPS: 100.0000 mg | ORAL_CAPSULE | Freq: Three times a day (TID) | ORAL | Status: DC | PRN

## 2015-07-06 NOTE — Patient Instructions (Addendum)
Your chest XR looks fine. We will treat your remaining symptoms with a little bit of prednisone to help with cough. Can take tesslon perles up to 3 times daily as needed for cough. Continue with your Robitussin as needed.  Please seek immediate medical attention if you develop shortness of breath not relieve by inhaler, chest pain/tightness, fever > 103 F or other concerning symptoms.

## 2015-07-06 NOTE — Assessment & Plan Note (Addendum)
CXR clear. Likely cough variant asthma vs. Post viral cough syndrome. Encouraged flonase and zyrtec OTC to help reduce drainage.  PRN inhaler. Course of prednisone.  Alarm symptoms reviewed.

## 2015-07-06 NOTE — Assessment & Plan Note (Signed)
Treat with prednisone for asthma exacerbation given persistent cough. Prednisone x 5 days. Encouraged inhaler use only when needed.

## 2015-07-06 NOTE — Progress Notes (Signed)
Subjective:    Patient ID: Derrick Gutierrez, male    DOB: [birth year], 35 y.o.   MRN: [id number]  HPI: Derrick Gutierrez is a 35 y.o. male presenting on 07/06/2015 for Cough   HPI  Pt presents for cough, cold, congestion x 3 weeks. No fevers at home. Symptoms began with head congestion and scratchy throat. Moved to his lungs. Cough is productive of white sputum. Previously treated on 12/14 for bronchitis with a Zpak and inhaler. Symptoms mildly improved. No shortness of breath of chest tightness. No wheezing at home. Using inhaler once every 4 hours just in case.  Home treatment: Robitussin at home- mild improvement.    Past Medical History  Diagnosis Date  . Asthma   . Anxiety   . Depression   . Seizures (HCC)   . ADHD (attention deficit hyperactivity disorder)     Current Outpatient Prescriptions on File Prior to Visit  Medication Sig  . albuterol (PROVENTIL HFA;VENTOLIN HFA) 108 (90 BASE) MCG/ACT inhaler Inhale 2 puffs into the lungs every 6 (six) hours as needed for wheezing or shortness of breath.  . amphetamine-dextroamphetamine (ADDERALL XR) 30 MG 24 hr capsule Take 30 mg by mouth daily.  . baclofen (LIORESAL) 10 MG tablet Take 1 tablet (10 mg total) by mouth 3 (three) times daily as needed for muscle spasms.  . fluticasone (FLONASE) 50 MCG/ACT nasal spray Place 2 sprays into both nostrils daily.  Marland Kitchen loratadine (CLARITIN) 10 MG tablet Take 1 tablet (10 mg total) by mouth daily.  . meloxicam (MOBIC) 15 MG tablet Take 1 tablet (15 mg total) by mouth daily as needed (Moderate pain).  . traZODone (DESYREL) 100 MG tablet Take 1 tablet (100 mg total) by mouth at bedtime as needed for sleep.  Marland Kitchen venlafaxine XR (EFFEXOR-XR) 75 MG 24 hr capsule Take 1 capsule (75 mg total) by mouth every morning.   No current facility-administered medications on file prior to visit.    Review of Systems  Constitutional: Negative for fever and chills.  HENT: Positive for postnasal drip. Negative for  congestion, sinus pressure, sneezing and trouble swallowing.   Respiratory: Positive for cough and chest tightness. Negative for shortness of breath and wheezing.   Cardiovascular: Negative for chest pain, palpitations and leg swelling.  Gastrointestinal: Negative for nausea and vomiting.  Musculoskeletal: Negative for neck pain and neck stiffness.  Skin: Negative for pallor and rash.  Neurological: Negative for dizziness and headaches.   Per HPI unless specifically indicated above     Objective:    BP 133/85 mmHg  Pulse 88  Temp(Src) 98.4 F (36.9 C) (Oral)  Resp 16  Ht 5\' 9"  (1.753 m)  Wt 259 lb (117.482 kg)  BMI 38.23 kg/m2  SpO2 100%  Wt Readings from Last 3 Encounters:  07/06/15 259 lb (117.482 kg)  06/16/15 260 lb (117.935 kg)  05/18/15 246 lb (111.585 kg)    Physical Exam  Constitutional: He appears well-developed and well-nourished. No distress.  HENT:  Head: Normocephalic and atraumatic.  Right Ear: Hearing and tympanic membrane normal. Tympanic membrane is not erythematous and not bulging.  Left Ear: Hearing and tympanic membrane normal. Tympanic membrane is not erythematous and not bulging.  Nose: Mucosal edema present. No rhinorrhea, sinus tenderness or nasal septal hematoma. Right sinus exhibits no maxillary sinus tenderness and no frontal sinus tenderness. Left sinus exhibits no maxillary sinus tenderness and no frontal sinus tenderness.  Mouth/Throat: Uvula is midline and mucous membranes are normal. No uvula swelling. Posterior oropharyngeal erythema  present. No posterior oropharyngeal edema.  Neck: Neck supple. No Brudzinski's sign and no Kernig's sign noted.  Cardiovascular: Normal rate, regular rhythm and normal heart sounds.   Pulmonary/Chest: No accessory muscle usage. No tachypnea. No respiratory distress. He has decreased breath sounds in the right middle field, the right lower field and the left lower field. He has no wheezes. He has no rhonchi. He has no  rales. Chest wall is not dull to percussion. He exhibits no tenderness.  Lymphadenopathy:    He has no cervical adenopathy.   Results for orders placed or performed during the hospital encounter of 05/17/15  Comprehensive metabolic panel  Result Value Ref Range   Sodium 140 135 - 145 mmol/L   Potassium 3.4 (L) 3.5 - 5.1 mmol/L   Chloride 107 101 - 111 mmol/L   CO2 26 22 - 32 mmol/L   Glucose, Bld 107 (H) 65 - 99 mg/dL   BUN 14 6 - 20 mg/dL   Creatinine, Ser 5.631.05 0.61 - 1.24 mg/dL   Calcium 9.1 8.9 - 87.510.3 mg/dL   Total Protein 8.2 (H) 6.5 - 8.1 g/dL   Albumin 4.3 3.5 - 5.0 g/dL   AST 17 15 - 41 U/L   ALT 11 (L) 17 - 63 U/L   Alkaline Phosphatase 111 38 - 126 U/L   Total Bilirubin 0.6 0.3 - 1.2 mg/dL   GFR calc non Af Amer >60 >60 mL/min   GFR calc Af Amer >60 >60 mL/min   Anion gap 7 5 - 15  Ethanol (ETOH)  Result Value Ref Range   Alcohol, Ethyl (B) <5 <5 mg/dL  Salicylate level  Result Value Ref Range   Salicylate Lvl <4.0 2.8 - 30.0 mg/dL  Acetaminophen level  Result Value Ref Range   Acetaminophen (Tylenol), Serum <10 (L) 10 - 30 ug/mL  CBC  Result Value Ref Range   WBC 9.5 3.8 - 10.6 K/uL   RBC 4.85 4.40 - 5.90 MIL/uL   Hemoglobin 14.6 13.0 - 18.0 g/dL   HCT 64.344.0 32.940.0 - 51.852.0 %   MCV 90.7 80.0 - 100.0 fL   MCH 30.2 26.0 - 34.0 pg   MCHC 33.3 32.0 - 36.0 g/dL   RDW 84.113.3 66.011.5 - 63.014.5 %   Platelets 256 150 - 440 K/uL  Urine Drug Screen, Qualitative ([facility] only)  Result Value Ref Range   Tricyclic, Ur Screen NONE DETECTED NONE DETECTED   Amphetamines, Ur Screen POSITIVE (A) NONE DETECTED   MDMA (Ecstasy)Ur Screen NONE DETECTED NONE DETECTED   Cocaine Metabolite,Ur [place] NONE DETECTED NONE DETECTED   Opiate, Ur Screen NONE DETECTED NONE DETECTED   Phencyclidine (PCP) Ur S NONE DETECTED NONE DETECTED   Cannabinoid 50 Ng, Ur [place]  NONE DETECTED NONE DETECTED   Barbiturates, Ur Screen NONE DETECTED NONE DETECTED   Benzodiazepine, Ur Scrn POSITIVE (A) NONE DETECTED    Methadone Scn, Ur NONE DETECTED NONE DETECTED  Glucose, capillary  Result Value Ref Range   Glucose-Capillary 96 65 - 99 mg/dL  Magnesium  Result Value Ref Range   Magnesium 1.9 1.7 - 2.4 mg/dL      Assessment & Plan:   Problem List Items Addressed This Visit      Respiratory   Asthma, mild intermittent    Treat with prednisone for asthma exacerbation given persistent cough. Prednisone x 5 days. Encouraged inhaler use only when needed.      Relevant Medications   predniSONE (DELTASONE) 20 MG tablet   Bronchitis - Primary  CXR clear. Likely cough variant asthma vs. Post viral cough syndrome. Encouraged flonase and zyrtec OTC to help reduce drainage.  PRN inhaler. Course of prednisone.  Alarm symptoms reviewed.       Relevant Medications   predniSONE (DELTASONE) 20 MG tablet   benzonatate (TESSALON) 100 MG capsule   Other Relevant Orders   DG Chest 2 View (Completed)    Other Visit Diagnoses    Cough        Supporitve care. Continue robitussin, tessalon perles PRN.     Relevant Medications    predniSONE (DELTASONE) 20 MG tablet    benzonatate (TESSALON) 100 MG capsule    Other Relevant Orders    DG Chest 2 View (Completed)       Meds ordered this encounter  Medications  . predniSONE (DELTASONE) 20 MG tablet    Sig: Take 2 tablets (40 mg total) by mouth daily with breakfast.    Dispense:  10 tablet    Refill:  0    Order Specific Question:  Supervising Provider    Answer:  Janeann Forehand [phone]  . benzonatate (TESSALON) 100 MG capsule    Sig: Take 1 capsule (100 mg total) by mouth 3 (three) times daily as needed for cough.    Dispense:  30 capsule    Refill:  0    Order Specific Question:  Supervising Provider    Answer:  Janeann Forehand [[id number]]      Follow up plan: Return if symptoms worsen or fail to improve.

## 2015-07-13 ENCOUNTER — Telehealth: Payer: Self-pay | Admitting: Family Medicine

## 2015-07-13 DIAGNOSIS — F332 Major depressive disorder, recurrent severe without psychotic features: Secondary | ICD-10-CM

## 2015-07-13 MED ORDER — VENLAFAXINE HCL ER 75 MG PO CP24: 75.0000 mg | ORAL_CAPSULE | Freq: Every morning | ORAL | Status: DC

## 2015-07-13 NOTE — Telephone Encounter (Signed)
Contact patient.  I thought that Psychiatrist was prescribing this for him.  Confirm please.-jh

## 2015-07-13 NOTE — Telephone Encounter (Signed)
Pt called requesting a refill on   effexor 75 mg.  Pt call back # is  [phone]

## 2015-07-13 NOTE — Telephone Encounter (Signed)
Go ahead and send in to his Pharmacy, Effexor, 75 mg., 1 daily.  #30 / 2 refills)  He will have to get this further from a Psychiatrist.  This is only to last until his appt. In March.

## 2015-07-13 NOTE — Telephone Encounter (Signed)
Called patient back for more detail. He says [facility] was the original prescriber. Dr. Adriana Simasook and Dr. Cherylann RatelLateef are not in network with is ins. Patient was put on waiting list with [facility] with March appt. He is not able to get medication refilled.

## 2015-07-13 NOTE — Telephone Encounter (Signed)
Called patient and let him know that he must get in with psychiatry. He has enough refills to last until March.

## 2015-07-19 ENCOUNTER — Other Ambulatory Visit: Payer: Self-pay | Admitting: Psychiatry

## 2015-07-29 DIAGNOSIS — F331 Major depressive disorder, recurrent, moderate: Secondary | ICD-10-CM | POA: Diagnosis not present

## 2015-08-10 ENCOUNTER — Encounter: Payer: Self-pay | Admitting: Family Medicine

## 2015-08-10 ENCOUNTER — Other Ambulatory Visit: Payer: Self-pay | Admitting: Family Medicine

## 2015-08-10 ENCOUNTER — Ambulatory Visit (INDEPENDENT_AMBULATORY_CARE_PROVIDER_SITE_OTHER): Payer: Medicare Other | Admitting: Family Medicine

## 2015-08-10 VITALS — BP 110/70 | HR 80 | Temp 98.5°F | Resp 18 | Ht 69.0 in | Wt 252.0 lb

## 2015-08-10 DIAGNOSIS — Z202 Contact with and (suspected) exposure to infections with a predominantly sexual mode of transmission: Secondary | ICD-10-CM | POA: Diagnosis not present

## 2015-08-10 MED ORDER — METRONIDAZOLE 500 MG PO TABS: ORAL_TABLET | ORAL | Status: DC

## 2015-08-10 NOTE — Addendum Note (Signed)
Addended by: Clayborne Artist A on: 08/10/2015 04:18 PM   Modules accepted: Orders

## 2015-08-10 NOTE — Progress Notes (Signed)
Name: Derrick Gutierrez   MRN: [id number]    DOB: 1980/10/14   Date:08/10/2015       Progress Note  Subjective  Chief Complaint  Chief Complaint  Patient presents with  . Exposure to STD    Pt would like to be tested for trichomoniasis; his wife had previously    HPI Wife reports that she tested positive for Trichamonas.  He was exposed sexually.  He hs no dysuria or frequency.   He is concerned about other STDs  No problem-specific assessment & plan notes found for this encounter.   Past Medical History  Diagnosis Date  . Asthma   . Anxiety   . Depression   . Seizures ([place])   . ADHD (attention deficit hyperactivity disorder)     Social History  Substance Use Topics  . Smoking status: Never Smoker   . Smokeless tobacco: Never Used  . Alcohol Use: No     Current outpatient prescriptions:  .  amphetamine-dextroamphetamine (ADDERALL XR) 30 MG 24 hr capsule, Take 30 mg by mouth daily., Disp: , Rfl:  .  baclofen (LIORESAL) 10 MG tablet, Take 1 tablet (10 mg total) by mouth 3 (three) times daily as needed for muscle spasms., Disp: 30 each, Rfl: 0 .  fluticasone (FLONASE) 50 MCG/ACT nasal spray, Place 2 sprays into both nostrils daily., Disp: , Rfl: 12 .  ketoconazole (NIZORAL) 2 % cream, APPLY AA BID, Disp: , Rfl: 11 .  meloxicam (MOBIC) 15 MG tablet, Take 1 tablet (15 mg total) by mouth daily as needed (Moderate pain)., Disp: 30 tablet, Rfl: 0 .  PROVENTIL HFA 108 (90 Base) MCG/ACT inhaler, INL 2 PFS INTO THE LUNGS Q 6 H PRF WHZ OR SOB, Disp: , Rfl: 3 .  traZODone (DESYREL) 100 MG tablet, Take 1 tablet (100 mg total) by mouth at bedtime as needed for sleep., Disp: 30 tablet, Rfl: 0 .  venlafaxine XR (EFFEXOR-XR) 75 MG 24 hr capsule, Take 1 capsule (75 mg total) by mouth every morning., Disp: 30 capsule, Rfl: 2 .  predniSONE (DELTASONE) 20 MG tablet, Take 2 tablets (40 mg total) by mouth daily with breakfast. (Patient not taking: Reported on 08/10/2015), Disp: 10 tablet, Rfl: 0  Not  on File  Review of Systems  Constitutional: Negative for fever, chills, weight loss and malaise/fatigue.  HENT: Negative for hearing loss.   Eyes: Negative for blurred vision and double vision.  Respiratory: Negative for cough, shortness of breath and wheezing.   Cardiovascular: Negative for chest pain, palpitations and leg swelling.  Gastrointestinal: Negative for heartburn, abdominal pain and blood in stool.  Genitourinary: Negative for dysuria, urgency and frequency.  Skin: Negative for rash.  Neurological: Negative for tremors, weakness and headaches.      Objective  Filed Vitals:   08/10/15 1441  BP: 110/70  Pulse: 80  Temp: 98.5 F (36.9 C)  TempSrc: Oral  Resp: 18  Height: '5\' 9"'$  (1.753 m)  Weight: 252 lb (114.306 kg)     Physical Exam  Cardiovascular: Normal rate, regular rhythm and normal heart sounds.  Exam reveals no gallop and no friction rub.   No murmur heard. Pulmonary/Chest: Effort normal and breath sounds normal. No respiratory distress. He has no wheezes. He has no rales.  Abdominal: Soft. Bowel sounds are normal. He exhibits no distension and no mass. There is no tenderness.      Recent Results (from the past 2160 hour(s))  Comprehensive metabolic panel     Status: Abnormal  Collection Time: 05/17/15 10:12 PM  Result Value Ref Range   Sodium 140 135 - 145 mmol/L   Potassium 3.4 (L) 3.5 - 5.1 mmol/L   Chloride 107 101 - 111 mmol/L   CO2 26 22 - 32 mmol/L   Glucose, Bld 107 (H) 65 - 99 mg/dL   BUN 14 6 - 20 mg/dL   Creatinine, Ser 1.05 0.61 - 1.24 mg/dL   Calcium 9.1 8.9 - 10.3 mg/dL   Total Protein 8.2 (H) 6.5 - 8.1 g/dL   Albumin 4.3 3.5 - 5.0 g/dL   AST 17 15 - 41 U/L   ALT 11 (L) 17 - 63 U/L   Alkaline Phosphatase 111 38 - 126 U/L   Total Bilirubin 0.6 0.3 - 1.2 mg/dL   GFR calc non Af Amer >60 >60 mL/min   GFR calc Af Amer >60 >60 mL/min    Comment: (NOTE) The eGFR has been calculated using the CKD EPI equation. This calculation has  not been validated in all clinical situations. eGFR's persistently <60 mL/min signify possible Chronic Kidney Disease.    Anion gap 7 5 - 15  Ethanol (ETOH)     Status: None   Collection Time: 05/17/15 10:12 PM  Result Value Ref Range   Alcohol, Ethyl (B) <5 <5 mg/dL    Comment:        LOWEST DETECTABLE LIMIT FOR SERUM ALCOHOL IS 5 mg/dL FOR MEDICAL PURPOSES ONLY   Salicylate level     Status: None   Collection Time: 05/17/15 10:12 PM  Result Value Ref Range   Salicylate Lvl <0.3 2.8 - 30.0 mg/dL  Acetaminophen level     Status: Abnormal   Collection Time: 05/17/15 10:12 PM  Result Value Ref Range   Acetaminophen (Tylenol), Serum <10 (L) 10 - 30 ug/mL    Comment:        THERAPEUTIC CONCENTRATIONS VARY SIGNIFICANTLY. A RANGE OF 10-30 ug/mL MAY BE AN EFFECTIVE CONCENTRATION FOR MANY PATIENTS. HOWEVER, SOME ARE BEST TREATED AT CONCENTRATIONS OUTSIDE THIS RANGE. ACETAMINOPHEN CONCENTRATIONS >150 ug/mL AT 4 HOURS AFTER INGESTION AND >50 ug/mL AT 12 HOURS AFTER INGESTION ARE OFTEN ASSOCIATED WITH TOXIC REACTIONS.   CBC     Status: None   Collection Time: 05/17/15 10:12 PM  Result Value Ref Range   WBC 9.5 3.8 - 10.6 K/uL   RBC 4.85 4.40 - 5.90 MIL/uL   Hemoglobin 14.6 13.0 - 18.0 g/dL   HCT 44.0 40.0 - 52.0 %   MCV 90.7 80.0 - 100.0 fL   MCH 30.2 26.0 - 34.0 pg   MCHC 33.3 32.0 - 36.0 g/dL   RDW 13.3 11.5 - 14.5 %   Platelets 256 150 - 440 K/uL  Urine Drug Screen, Qualitative ([facility] only)     Status: Abnormal   Collection Time: 05/17/15 10:12 PM  Result Value Ref Range   Tricyclic, Ur Screen NONE DETECTED NONE DETECTED   Amphetamines, Ur Screen POSITIVE (A) NONE DETECTED   MDMA (Ecstasy)Ur Screen NONE DETECTED NONE DETECTED   Cocaine Metabolite,Ur [place] NONE DETECTED NONE DETECTED   Opiate, Ur Screen NONE DETECTED NONE DETECTED   Phencyclidine (PCP) Ur S NONE DETECTED NONE DETECTED   Cannabinoid 50 Ng, Ur  NONE DETECTED NONE DETECTED   Barbiturates, Ur Screen NONE  DETECTED NONE DETECTED   Benzodiazepine, Ur Scrn POSITIVE (A) NONE DETECTED   Methadone Scn, Ur NONE DETECTED NONE DETECTED    Comment: (NOTE) 474  Tricyclics, urine  Cutoff 1000 ng/mL 200  Amphetamines, urine             Cutoff 1000 ng/mL 300  MDMA (Ecstasy), urine           Cutoff 500 ng/mL 400  Cocaine Metabolite, urine       Cutoff 300 ng/mL 500  Opiate, urine                   Cutoff 300 ng/mL 600  Phencyclidine (PCP), urine      Cutoff 25 ng/mL 700  Cannabinoid, urine              Cutoff 50 ng/mL 800  Barbiturates, urine             Cutoff 200 ng/mL 900  Benzodiazepine, urine           Cutoff 200 ng/mL 1000 Methadone, urine                Cutoff 300 ng/mL 1100 1200 The urine drug screen provides only a preliminary, unconfirmed 1300 analytical test result and should not be used for non-medical 1400 purposes. Clinical consideration and professional judgment should 1500 be applied to any positive drug screen result due to possible 1600 interfering substances. A more specific alternate chemical method 1700 must be used in order to obtain a confirmed analytical result.  1800 Gas chromato graphy / mass spectrometry (GC/MS) is the preferred 1900 confirmatory method.   Magnesium     Status: None   Collection Time: 05/17/15 10:12 PM  Result Value Ref Range   Magnesium 1.9 1.7 - 2.4 mg/dL  Glucose, capillary     Status: None   Collection Time: 05/17/15 10:25 PM  Result Value Ref Range   Glucose-Capillary 96 65 - 99 mg/dL     Assessment & Plan  1. STD exposure  - GC/Chlamydia Probe Amp - metroNIDAZOLE (FLAGYL) 500 MG tablet; Take 4 pills at one time after a large meal.  No alcohol 2 days before or after.  Dispense: 4 tablet; Refill: 0  Discussed getting himself and wife tested for HIV, Syphilis, and Hep C at [place].  Info given.

## 2015-08-12 ENCOUNTER — Telehealth: Payer: Self-pay | Admitting: Family Medicine

## 2015-08-12 DIAGNOSIS — F332 Major depressive disorder, recurrent severe without psychotic features: Secondary | ICD-10-CM | POA: Diagnosis not present

## 2015-08-12 LAB — GC/CHLAMYDIA PROBE AMP
Chlamydia trachomatis, NAA: NEGATIVE
Neisseria gonorrhoeae by PCR: NEGATIVE

## 2015-08-12 LAB — PLEASE NOTE

## 2015-08-12 NOTE — Telephone Encounter (Signed)
Pt advised.

## 2015-08-12 NOTE — Telephone Encounter (Signed)
Pt called for result of test.  Please call [phone]

## 2015-09-06 ENCOUNTER — Ambulatory Visit: Payer: Self-pay | Admitting: Family Medicine

## 2015-09-07 ENCOUNTER — Ambulatory Visit: Payer: Self-pay | Admitting: Family Medicine

## 2015-09-13 DIAGNOSIS — F332 Major depressive disorder, recurrent severe without psychotic features: Secondary | ICD-10-CM | POA: Diagnosis not present

## 2015-09-14 ENCOUNTER — Encounter: Payer: Self-pay | Admitting: Psychiatry

## 2015-09-14 ENCOUNTER — Ambulatory Visit (INDEPENDENT_AMBULATORY_CARE_PROVIDER_SITE_OTHER): Payer: Medicare Other | Admitting: Psychiatry

## 2015-09-14 VITALS — BP 122/64 | HR 114 | Temp 97.1°F | Ht 69.0 in | Wt 247.8 lb

## 2015-09-14 DIAGNOSIS — F332 Major depressive disorder, recurrent severe without psychotic features: Secondary | ICD-10-CM

## 2015-09-14 DIAGNOSIS — F331 Major depressive disorder, recurrent, moderate: Secondary | ICD-10-CM | POA: Diagnosis not present

## 2015-09-14 DIAGNOSIS — F909 Attention-deficit hyperactivity disorder, unspecified type: Secondary | ICD-10-CM

## 2015-09-14 DIAGNOSIS — F988 Other specified behavioral and emotional disorders with onset usually occurring in childhood and adolescence: Secondary | ICD-10-CM

## 2015-09-14 MED ORDER — TRAZODONE HCL 100 MG PO TABS: 100.0000 mg | ORAL_TABLET | Freq: Every evening | ORAL | Status: DC | PRN

## 2015-09-14 MED ORDER — VENLAFAXINE HCL ER 150 MG PO CP24: 150.0000 mg | ORAL_CAPSULE | Freq: Every morning | ORAL | Status: DC

## 2015-09-14 MED ORDER — AMPHETAMINE-DEXTROAMPHET ER 30 MG PO CP24: 30.0000 mg | ORAL_CAPSULE | Freq: Every day | ORAL | Status: DC
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
## 2015-09-20 ENCOUNTER — Ambulatory Visit (INDEPENDENT_AMBULATORY_CARE_PROVIDER_SITE_OTHER): Payer: Medicare Other | Admitting: Family Medicine

## 2015-09-20 ENCOUNTER — Encounter: Payer: Self-pay | Admitting: Family Medicine

## 2015-09-20 VITALS — BP 119/84 | HR 101 | Temp 98.7°F | Resp 16 | Ht 69.0 in | Wt 246.0 lb

## 2015-09-20 DIAGNOSIS — R059 Cough, unspecified: Secondary | ICD-10-CM

## 2015-09-20 DIAGNOSIS — R05 Cough: Secondary | ICD-10-CM

## 2015-09-20 DIAGNOSIS — J069 Acute upper respiratory infection, unspecified: Secondary | ICD-10-CM

## 2015-09-20 MED ORDER — DM-GUAIFENESIN ER 30-600 MG PO TB12: 1.0000 | ORAL_TABLET | Freq: Two times a day (BID) | ORAL | Status: DC

## 2015-09-20 MED ORDER — LORATADINE 10 MG PO TABS: 10.0000 mg | ORAL_TABLET | Freq: Every day | ORAL | Status: DC

## 2015-09-20 NOTE — Progress Notes (Signed)
Name: Derrick Gutierrez   MRBarbette Reichmann: [id number]    DOB: 09-25-1980   Date:09/20/2015       Progress Note  Subjective  Chief Complaint  Chief Complaint  Patient presents with  . Nasal Congestion    still has cough mucus whittish    HPI C/o nasal congestion x 1 week.  Cough started the same time.  Cough with white phlegm.  Nose white mucus.  No wheezes.  No fever.  Som,e mild SOB with physical work out.  No HA.    No problem-specific assessment & plan notes found for this encounter.   Past Medical History  Diagnosis Date  . Asthma   . Anxiety   . Depression   . Seizures (HCC)   . ADHD (attention deficit hyperactivity disorder)     Social History  Substance Use Topics  . Smoking status: Never Smoker   . Smokeless tobacco: Never Used  . Alcohol Use: No     Current outpatient prescriptions:  .  amphetamine-dextroamphetamine (ADDERALL XR) 30 MG 24 hr capsule, Take 30 mg by mouth daily., Disp: , Rfl:  .  baclofen (LIORESAL) 10 MG tablet, Take 1 tablet (10 mg total) by mouth 3 (three) times daily as needed for muscle spasms., Disp: 30 each, Rfl: 0 .  fluticasone (FLONASE) 50 MCG/ACT nasal spray, Place 2 sprays into both nostrils daily., Disp: , Rfl: 12 .  ketoconazole (NIZORAL) 2 % cream, APPLY AA BID, Disp: , Rfl: 11 .  meloxicam (MOBIC) 15 MG tablet, Take 1 tablet (15 mg total) by mouth daily as needed (Moderate pain)., Disp: 30 tablet, Rfl: 0 .  PROVENTIL HFA 108 (90 Base) MCG/ACT inhaler, Reported on 09/14/2015, Disp: , Rfl: 3 .  traZODone (DESYREL) 100 MG tablet, Take 1 tablet (100 mg total) by mouth at bedtime as needed for sleep., Disp: 30 tablet, Rfl: 1 .  venlafaxine XR (EFFEXOR-XR) 150 MG 24 hr capsule, Take 1 capsule (150 mg total) by mouth every morning., Disp: 30 capsule, Rfl: 1  Not on File  Review of Systems  Constitutional: Negative for fever, chills, weight loss and malaise/fatigue.  HENT: Positive for congestion. Negative for ear pain, hearing loss and sore throat.    Eyes: Negative for blurred vision and double vision.  Respiratory: Positive for cough. Negative for wheezing. Shortness of breath: +/-   Cardiovascular: Negative for chest pain, palpitations and leg swelling.  Gastrointestinal: Negative for heartburn, abdominal pain and blood in stool.  Genitourinary: Negative for dysuria, urgency and frequency.  Skin: Negative for rash.  Neurological: Negative for weakness and headaches.      Objective  Filed Vitals:   09/20/15 1409  BP: 119/84  Pulse: 101  Temp: 98.7 F (37.1 C)  TempSrc: Oral  Resp: 16  Height: 5\' 9"  (1.753 m)  Weight: 246 lb (111.585 kg)     Physical Exam  Constitutional: He is oriented to person, place, and time and well-developed, well-nourished, and in no distress. No distress.  HENT:  Head: Normocephalic and atraumatic.  Right Ear: External ear normal.  Left Ear: External ear normal.  Nose: Rhinorrhea (clear) present. Right sinus exhibits no maxillary sinus tenderness and no frontal sinus tenderness. Left sinus exhibits no maxillary sinus tenderness and no frontal sinus tenderness.  Mouth/Throat: Oropharynx is clear and moist.  Cardiovascular: Normal rate, regular rhythm, normal heart sounds and intact distal pulses.   Pulmonary/Chest: Effort normal and breath sounds normal. No respiratory distress. He has no wheezes. He has no rales.  Lymphadenopathy:  He has no cervical adenopathy.  Neurological: He is alert and oriented to person, place, and time.  Vitals reviewed.     Recent Results (from the past 2160 hour(s))  GC/Chlamydia Probe Amp     Status: None   Collection Time: 08/10/15 12:00 AM  Result Value Ref Range   Chlamydia trachomatis, NAA Negative Negative   Neisseria gonorrhoeae by PCR Negative Negative  Please Note     Status: None   Collection Time: 08/10/15 12:00 AM  Result Value Ref Range   Please note Comment     Comment: The date and/or time of collection was not indicated on  the requisition as required by state and federal law.  The date of receipt of the specimen was used as the collection date if not supplied.      Assessment & Plan  1. Upper respiratory infection  - loratadine (CLARITIN) 10 MG tablet; Take 1 tablet (10 mg total) by mouth daily.  Dispense: 30 tablet; Refill: 0  2. Cough  - dextromethorphan-guaiFENesin (MUCINEX DM) 30-600 MG 12hr tablet; Take 1 tablet by mouth 2 (two) times daily.  Dispense: 20 tablet; Refill: o

## 2015-09-20 NOTE — Patient Instructions (Signed)
Use Albuterol inhaler and Flonase as directed as needed.

## 2015-10-04 ENCOUNTER — Telehealth: Payer: Self-pay | Admitting: Family Medicine

## 2015-10-04 MED ORDER — KETOCONAZOLE 2 % EX CREA: TOPICAL_CREAM | Freq: Two times a day (BID) | CUTANEOUS | Status: DC

## 2015-10-04 NOTE — Telephone Encounter (Signed)
Sent refill and notified

## 2015-10-04 NOTE — Telephone Encounter (Signed)
Can he get that refill and what is the direction ?

## 2015-10-04 NOTE — Telephone Encounter (Signed)
Pt needs a refill on [facility]oconazole cream [facility].  His call back number is [phone]

## 2015-10-04 NOTE — Telephone Encounter (Signed)
OK for Ketoconazole Cream (2%).  Apply to affected area twice a day.  60 gram tube with 3 refills.  Send to his pharmacy.-jh

## 2015-10-14 ENCOUNTER — Ambulatory Visit: Payer: Medicare Other | Admitting: Family Medicine

## 2015-10-26 ENCOUNTER — Encounter: Payer: Self-pay | Admitting: Emergency Medicine

## 2015-10-26 ENCOUNTER — Encounter: Payer: Self-pay | Admitting: Psychiatry

## 2015-10-26 ENCOUNTER — Emergency Department
Admission: EM | Admit: 2015-10-26 | Discharge: 2015-10-26 | Disposition: A | Discharge status: Home / Self Care | Payer: Medicare Other | Attending: Emergency Medicine | Admitting: Emergency Medicine

## 2015-10-26 ENCOUNTER — Ambulatory Visit (INDEPENDENT_AMBULATORY_CARE_PROVIDER_SITE_OTHER): Payer: 59 | Admitting: Psychiatry

## 2015-10-26 VITALS — BP 118/78 | HR 127 | Temp 97.7°F | Ht 69.0 in | Wt 251.2 lb

## 2015-10-26 DIAGNOSIS — F988 Other specified behavioral and emotional disorders with onset usually occurring in childhood and adolescence: Secondary | ICD-10-CM

## 2015-10-26 DIAGNOSIS — J4520 Mild intermittent asthma, uncomplicated: Secondary | ICD-10-CM | POA: Diagnosis not present

## 2015-10-26 DIAGNOSIS — E669 Obesity, unspecified: Secondary | ICD-10-CM | POA: Diagnosis not present

## 2015-10-26 DIAGNOSIS — Z79899 Other long term (current) drug therapy: Secondary | ICD-10-CM | POA: Diagnosis not present

## 2015-10-26 DIAGNOSIS — F333 Major depressive disorder, recurrent, severe with psychotic symptoms: Secondary | ICD-10-CM | POA: Insufficient documentation

## 2015-10-26 DIAGNOSIS — J069 Acute upper respiratory infection, unspecified: Secondary | ICD-10-CM | POA: Insufficient documentation

## 2015-10-26 DIAGNOSIS — F331 Major depressive disorder, recurrent, moderate: Secondary | ICD-10-CM

## 2015-10-26 DIAGNOSIS — R05 Cough: Secondary | ICD-10-CM | POA: Diagnosis present

## 2015-10-26 DIAGNOSIS — F332 Major depressive disorder, recurrent severe without psychotic features: Secondary | ICD-10-CM

## 2015-10-26 DIAGNOSIS — Z7951 Long term (current) use of inhaled steroids: Secondary | ICD-10-CM | POA: Diagnosis not present

## 2015-10-26 DIAGNOSIS — F909 Attention-deficit hyperactivity disorder, unspecified type: Secondary | ICD-10-CM

## 2015-10-26 DIAGNOSIS — J029 Acute pharyngitis, unspecified: Secondary | ICD-10-CM | POA: Diagnosis not present

## 2015-10-26 MED ORDER — GUAIFENESIN-CODEINE 100-10 MG/5ML PO SOLN: 10.0000 mL | ORAL | Status: DC | PRN

## 2015-10-26 MED ORDER — TRAZODONE HCL 100 MG PO TABS: 100.0000 mg | ORAL_TABLET | Freq: Every evening | ORAL | Status: DC | PRN

## 2015-10-26 MED ORDER — AZITHROMYCIN 250 MG PO TABS: ORAL_TABLET | ORAL | Status: DC

## 2015-10-26 MED ORDER — IBUPROFEN 800 MG PO TABS: 800.0000 mg | ORAL_TABLET | Freq: Three times a day (TID) | ORAL | Status: DC | PRN

## 2015-10-26 MED ORDER — AMPHETAMINE-DEXTROAMPHET ER 30 MG PO CP24: 30.0000 mg | ORAL_CAPSULE | Freq: Every day | ORAL | Status: DC

## 2015-10-26 MED ORDER — VENLAFAXINE HCL ER 150 MG PO CP24: 150.0000 mg | ORAL_CAPSULE | Freq: Every morning | ORAL | Status: DC

## 2015-10-26 MED ORDER — IBUPROFEN 800 MG PO TABS
800.0000 mg | ORAL_TABLET | Freq: Once | ORAL | Status: AC
  Administered 2015-10-26: 800 mg via ORAL
  Filled 2015-10-26: qty 1

## 2015-10-26 MED ORDER — GUAIFENESIN-CODEINE 100-10 MG/5ML PO SOLN
10.0000 mL | Freq: Once | ORAL | Status: AC
  Administered 2015-10-26: 10 mL via ORAL
  Filled 2015-10-26: qty 10

## 2015-10-26 MED ORDER — AZITHROMYCIN 500 MG PO TABS
500.0000 mg | ORAL_TABLET | Freq: Once | ORAL | Status: AC
  Administered 2015-10-26: 500 mg via ORAL
  Filled 2015-10-26: qty 1
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
## 2015-10-26 NOTE — ED Notes (Signed)
States he woke up with cough and congestion.  No resp distress

## 2015-10-26 NOTE — ED Notes (Signed)
Pt discharged home after verbalizing understanding of discharge instructions; nad noted. 

## 2015-10-26 NOTE — Discharge Instructions (Signed)
Upper Respiratory Infection, Adult Most upper respiratory infections (URIs) are a viral infection of the air passages leading to the lungs. A URI affects the nose, throat, and upper air passages. The most common type of URI is nasopharyngitis and is typically referred to as "the common cold." URIs run their course and usually go away on their own. Most of the time, a URI does not require medical attention, but sometimes a bacterial infection in the upper airways can follow a viral infection. This is called a secondary infection. Sinus and middle ear infections are common types of secondary upper respiratory infections. Bacterial pneumonia can also complicate a URI. A URI can worsen asthma and chronic obstructive pulmonary disease (COPD). Sometimes, these complications can require emergency medical care and may be life threatening.  CAUSES Almost all URIs are caused by viruses. A virus is a type of germ and can spread from one person to another.  RISKS FACTORS You may be at risk for a URI if:   You smoke.   You have chronic heart or lung disease.  You have a weakened defense (immune) system.   You are very young or very old.   You have nasal allergies or asthma.  You work in crowded or poorly ventilated areas.  You work in health care facilities or schools. SIGNS AND SYMPTOMS  Symptoms typically develop 2-3 days after you come in contact with a cold virus. Most viral URIs last 7-10 days. However, viral URIs from the influenza virus (flu virus) can last 14-18 days and are typically more severe. Symptoms may include:   Runny or stuffy (congested) nose.   Sneezing.   Cough.   Sore throat.   Headache.   Fatigue.   Fever.   Loss of appetite.   Pain in your forehead, behind your eyes, and over your cheekbones (sinus pain).  Muscle aches.  DIAGNOSIS  Your health care provider may diagnose a URI by:  Physical exam.  Tests to check that your symptoms are not due to  another condition such as:  Strep throat.  Sinusitis.  Pneumonia.  Asthma. TREATMENT  A URI goes away on its own with time. It cannot be cured with medicines, but medicines may be prescribed or recommended to relieve symptoms. Medicines may help:  Reduce your fever.  Reduce your cough.  Relieve nasal congestion. HOME CARE INSTRUCTIONS   Take medicines only as directed by your health care provider.   Gargle warm saltwater or take cough drops to comfort your throat as directed by your health care provider.  Use a warm mist humidifier or inhale steam from a shower to increase air moisture. This may make it easier to breathe.  Drink enough fluid to keep your urine clear or pale yellow.   Eat soups and other clear broths and maintain good nutrition.   Rest as needed.   Return to work when your temperature has returned to normal or as your health care provider advises. You may need to stay home longer to avoid infecting others. You can also use a face mask and careful hand washing to prevent spread of the virus.  Increase the usage of your inhaler if you have asthma.   Do not use any tobacco products, including cigarettes, chewing tobacco, or electronic cigarettes. If you need help quitting, ask your health care provider. PREVENTION  The best way to protect yourself from getting a cold is to practice good hygiene.   Avoid oral or hand contact with people with cold   symptoms.   Wash your hands often if contact occurs.  There is no clear evidence that vitamin C, vitamin E, echinacea, or exercise reduces the chance of developing a cold. However, it is always recommended to get plenty of rest, exercise, and practice good nutrition.  SEEK MEDICAL CARE IF:   You are getting worse rather than better.   Your symptoms are not controlled by medicine.   You have chills.  You have worsening shortness of breath.  You have brown or red mucus.  You have yellow or brown nasal  discharge.  You have pain in your face, especially when you bend forward.  You have a fever.  You have swollen neck glands.  You have pain while swallowing.  You have white areas in the back of your throat. SEEK IMMEDIATE MEDICAL CARE IF:   You have severe or persistent:  Headache.  Ear pain.  Sinus pain.  Chest pain.  You have chronic lung disease and any of the following:  Wheezing.  Prolonged cough.  Coughing up blood.  A change in your usual mucus.  You have a stiff neck.  You have changes in your:  Vision.  Hearing.  Thinking.  Mood. MAKE SURE YOU:   Understand these instructions.  Will watch your condition.  Will get help right away if you are not doing well or get worse.   This information is not intended to replace advice given to you by your health care provider. Make sure you discuss any questions you have with your health care provider.   Document Released: 12/13/2000 Document Revised: 11/03/2014 Document Reviewed: 09/24/2013 Elsevier Interactive Patient Education 2016 Elsevier Inc.  
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
## 2015-11-09 ENCOUNTER — Encounter: Payer: Self-pay | Admitting: Family Medicine

## 2015-11-09 ENCOUNTER — Ambulatory Visit
Admission: RE | Admit: 2015-11-09 | Discharge: 2015-11-09 | Disposition: A | Discharge status: Home / Self Care | Payer: Medicare Other | Source: Ambulatory Visit | Attending: Family Medicine | Admitting: Family Medicine

## 2015-11-09 ENCOUNTER — Ambulatory Visit (INDEPENDENT_AMBULATORY_CARE_PROVIDER_SITE_OTHER): Payer: Medicare Other | Admitting: Family Medicine

## 2015-11-09 VITALS — BP 113/73 | HR 89 | Temp 98.2°F | Resp 16 | Ht 69.0 in | Wt 250.0 lb

## 2015-11-09 DIAGNOSIS — R918 Other nonspecific abnormal finding of lung field: Secondary | ICD-10-CM | POA: Insufficient documentation

## 2015-11-09 DIAGNOSIS — J069 Acute upper respiratory infection, unspecified: Secondary | ICD-10-CM

## 2015-11-09 DIAGNOSIS — R05 Cough: Secondary | ICD-10-CM

## 2015-11-09 DIAGNOSIS — R059 Cough, unspecified: Secondary | ICD-10-CM

## 2015-11-09 MED ORDER — DM-GUAIFENESIN ER 30-600 MG PO TB12: 1.0000 | ORAL_TABLET | Freq: Two times a day (BID) | ORAL | Status: DC

## 2015-11-09 MED ORDER — PREDNISONE 20 MG PO TABS: 20.0000 mg | ORAL_TABLET | Freq: Every day | ORAL | Status: DC

## 2015-11-09 NOTE — Patient Instructions (Signed)
You can use supportive care at home to help with your symptoms. I have sent Mucinex DM to your pharmacy to help break up the congestion and soothe your cough. You can takes this twice daily.  Derrick Gutierrez is a natural cough suppressant- so add it to your tea in the morning.  If you have a humidifer, set that up in your bedroom at night.   Please seek immediate medical attention if you develop shortness of breath not relieve by inhaler, chest pain/tightness, fever > 103 F or other concerning symptoms.

## 2015-11-09 NOTE — Progress Notes (Signed)
Subjective:    Patient ID: Derrick Gutierrez, male    DOB: 1981-04-03, 35 y.o.   MRN: [id number]  HPI: Derrick Gutierrez is a 35 y.o. male presenting on 11/09/2015 for Cough   HPI  Pt presents for cough and congestion. Seen in ER last week. Started on a Zpak. Completed Zpak.  Feeling better but cough is still present. Cough is productive of white and clear sputum. No chest XR in ER. Having some chest tightness. No fevers at home. Has inhaler at home. Using 4 times per day- as prescribed by provider in the ER. Has taken prednisone in the past with good effect for chest tightness.    Past Medical History  Diagnosis Date  . Asthma   . Anxiety   . Depression   . Seizures (HCC)   . ADHD (attention deficit hyperactivity disorder)     Current Outpatient Prescriptions on File Prior to Visit  Medication Sig  . amphetamine-dextroamphetamine (ADDERALL XR) 30 MG 24 hr capsule Take 1 capsule (30 mg total) by mouth daily.  Marland Kitchen azithromycin (ZITHROMAX Z-PAK) 250 MG tablet Take 2 tablets (500 mg) on  Day 1,  followed by 1 tablet (250 mg) once daily on Days 2 through 5.  . baclofen (LIORESAL) 10 MG tablet Take 1 tablet (10 mg total) by mouth 3 (three) times daily as needed for muscle spasms.  Marland Kitchen dextromethorphan-guaiFENesin (MUCINEX DM) 30-600 MG 12hr tablet Take 1 tablet by mouth 2 (two) times daily.  . fluticasone (FLONASE) 50 MCG/ACT nasal spray Place 2 sprays into both nostrils daily.  Marland Kitchen guaiFENesin-codeine 100-10 MG/5ML syrup Take 10 mLs by mouth every 4 (four) hours as needed for cough.  Marland Kitchen ibuprofen (ADVIL,MOTRIN) 800 MG tablet Take 1 tablet (800 mg total) by mouth every 8 (eight) hours as needed.  Marland Kitchen ketoconazole (NIZORAL) 2 % cream Apply topically 2 (two) times daily.  Marland Kitchen loratadine (CLARITIN) 10 MG tablet Take 1 tablet (10 mg total) by mouth daily.  Marland Kitchen PROVENTIL HFA 108 (90 Base) MCG/ACT inhaler Reported on 09/14/2015  . traZODone (DESYREL) 100 MG tablet Take 1 tablet (100 mg total) by mouth at bedtime  as needed for sleep.  Marland Kitchen venlafaxine XR (EFFEXOR-XR) 150 MG 24 hr capsule Take 1 capsule (150 mg total) by mouth every morning.   No current facility-administered medications on file prior to visit.    Review of Systems  Constitutional: Negative for fever and chills.  HENT: Positive for congestion, postnasal drip and rhinorrhea. Negative for ear pain and sneezing.   Respiratory: Positive for cough and chest tightness. Negative for shortness of breath and wheezing.   Cardiovascular: Negative for chest pain, palpitations and leg swelling.  Skin: Negative.   Allergic/Immunologic: Positive for environmental allergies.  Neurological: Negative for headaches.   Per HPI unless specifically indicated above     Objective:    BP 113/73 mmHg  Pulse 89  Temp(Src) 98.2 F (36.8 C) (Oral)  Resp 16  Ht  (1.753 m)  Wt 250 lb (113.399 kg)  BMI 36.90 kg/m2  SpO2 99%  Wt Readings from Last 3 Encounters:  11/09/15 250 lb (113.399 kg)  10/26/15 252 lb (114.306 kg)  10/26/15 251 lb 3.2 oz (113.944 kg)    Physical Exam  Constitutional: He appears well-developed and well-nourished. No distress.  HENT:  Head: Normocephalic and atraumatic.  Right Ear: Hearing and tympanic membrane normal. Tympanic membrane is not erythematous and not bulging.  Left Ear: Hearing and tympanic membrane normal. Tympanic membrane is not erythematous  and not bulging.  Nose: Mucosal edema and rhinorrhea present. No sinus tenderness or nasal septal hematoma. Right sinus exhibits no maxillary sinus tenderness and no frontal sinus tenderness. Left sinus exhibits no maxillary sinus tenderness and no frontal sinus tenderness.  Mouth/Throat: Uvula is midline and mucous membranes are normal. No uvula swelling. Posterior oropharyngeal erythema (mild. ) present. No posterior oropharyngeal edema.  Neck: Neck supple. No Brudzinski's sign and no Kernig's sign noted.  Cardiovascular: Normal rate, regular rhythm and normal heart  sounds.   Pulmonary/Chest: Breath sounds normal. No accessory muscle usage. No tachypnea. No respiratory distress. He has no decreased breath sounds. He has no wheezes. He has no rhonchi. He has no rales. Chest wall is not dull to percussion. He exhibits no tenderness.  Lymphadenopathy:    He has no cervical adenopathy.   Results for orders placed or performed in visit on 08/10/15  GC/Chlamydia Probe Amp  Result Value Ref Range   Chlamydia trachomatis, NAA Negative Negative   Neisseria gonorrhoeae by PCR Negative Negative  Please Note  Result Value Ref Range   Please note Comment       Assessment & Plan:   Problem List Items Addressed This Visit    None      No orders of the defined types were placed in this encounter.      Follow up plan: No Follow-up on file.

## 2015-11-09 NOTE — Assessment & Plan Note (Signed)
URI likely bronchitis. Completed Zpak. CXR to r/o pneumonia given ongoing chest tightness. Continue albuterol inhaler. Add prednisone 40mg  once daily x 5 days. Alarm symptoms reviewed. Return precautions reviewed.

## 2015-11-25 ENCOUNTER — Encounter: Payer: Self-pay | Admitting: Psychiatry

## 2015-11-25 ENCOUNTER — Ambulatory Visit (INDEPENDENT_AMBULATORY_CARE_PROVIDER_SITE_OTHER): Payer: Medicare Other | Admitting: Psychiatry

## 2015-11-25 VITALS — BP 132/82 | HR 114 | Temp 97.4°F | Ht 69.0 in | Wt 246.4 lb

## 2015-11-25 DIAGNOSIS — F332 Major depressive disorder, recurrent severe without psychotic features: Secondary | ICD-10-CM

## 2015-11-25 DIAGNOSIS — F331 Major depressive disorder, recurrent, moderate: Secondary | ICD-10-CM

## 2015-11-25 DIAGNOSIS — F909 Attention-deficit hyperactivity disorder, unspecified type: Secondary | ICD-10-CM

## 2015-11-25 DIAGNOSIS — F988 Other specified behavioral and emotional disorders with onset usually occurring in childhood and adolescence: Secondary | ICD-10-CM

## 2015-11-25 MED ORDER — VENLAFAXINE HCL ER 150 MG PO CP24: 150.0000 mg | ORAL_CAPSULE | Freq: Every morning | ORAL | Status: DC

## 2015-11-25 MED ORDER — TRAZODONE HCL 100 MG PO TABS: 100.0000 mg | ORAL_TABLET | Freq: Every evening | ORAL | Status: DC | PRN

## 2015-11-25 MED ORDER — BUSPIRONE HCL 10 MG PO TABS: 10.0000 mg | ORAL_TABLET | Freq: Every day | ORAL | Status: DC

## 2015-11-25 MED ORDER — AMPHETAMINE-DEXTROAMPHET ER 25 MG PO CP24: 25.0000 mg | ORAL_CAPSULE | Freq: Every day | ORAL | Status: DC
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
## 2015-12-13 ENCOUNTER — Ambulatory Visit: Payer: Self-pay | Admitting: Family Medicine

## 2015-12-20 ENCOUNTER — Ambulatory Visit (INDEPENDENT_AMBULATORY_CARE_PROVIDER_SITE_OTHER): Payer: Medicare Other | Admitting: Psychiatry

## 2015-12-20 ENCOUNTER — Other Ambulatory Visit: Payer: Self-pay | Admitting: Psychiatry

## 2015-12-20 ENCOUNTER — Encounter: Payer: Self-pay | Admitting: Psychiatry

## 2015-12-20 VITALS — BP 120/82 | HR 116 | Temp 97.3°F | Ht 69.0 in | Wt 249.0 lb

## 2015-12-20 DIAGNOSIS — F332 Major depressive disorder, recurrent severe without psychotic features: Secondary | ICD-10-CM

## 2015-12-20 DIAGNOSIS — F909 Attention-deficit hyperactivity disorder, unspecified type: Secondary | ICD-10-CM | POA: Diagnosis not present

## 2015-12-20 DIAGNOSIS — F988 Other specified behavioral and emotional disorders with onset usually occurring in childhood and adolescence: Secondary | ICD-10-CM

## 2015-12-20 DIAGNOSIS — F331 Major depressive disorder, recurrent, moderate: Secondary | ICD-10-CM

## 2015-12-20 MED ORDER — BUSPIRONE HCL 10 MG PO TABS: 10.0000 mg | ORAL_TABLET | Freq: Every day | ORAL | Status: DC

## 2015-12-20 MED ORDER — AMPHETAMINE-DEXTROAMPHET ER 25 MG PO CP24: 25.0000 mg | ORAL_CAPSULE | Freq: Every day | ORAL | Status: DC

## 2015-12-20 MED ORDER — VENLAFAXINE HCL ER 150 MG PO CP24: 150.0000 mg | ORAL_CAPSULE | Freq: Every morning | ORAL | Status: DC

## 2015-12-20 MED ORDER — TRAZODONE HCL 100 MG PO TABS: 100.0000 mg | ORAL_TABLET | Freq: Every evening | ORAL | Status: DC | PRN
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
## 2016-01-10 ENCOUNTER — Ambulatory Visit (INDEPENDENT_AMBULATORY_CARE_PROVIDER_SITE_OTHER): Payer: Medicare Other | Admitting: Family Medicine

## 2016-01-10 ENCOUNTER — Encounter: Payer: Self-pay | Admitting: Family Medicine

## 2016-01-10 ENCOUNTER — Ambulatory Visit
Admission: RE | Admit: 2016-01-10 | Discharge: 2016-01-10 | Disposition: A | Discharge status: Home / Self Care | Payer: Medicare Other | Source: Ambulatory Visit | Attending: Family Medicine | Admitting: Family Medicine

## 2016-01-10 VITALS — BP 140/76 | HR 80 | Temp 98.3°F | Resp 16 | Ht 69.0 in | Wt 249.0 lb

## 2016-01-10 DIAGNOSIS — R102 Pelvic and perineal pain: Secondary | ICD-10-CM

## 2016-01-10 LAB — POCT URINALYSIS DIPSTICK
Bilirubin, UA: NEGATIVE
Glucose, UA: NEGATIVE
Ketones, UA: NEGATIVE
Leukocytes, UA: NEGATIVE
Nitrite, UA: NEGATIVE
PH UA: 6.5
PROTEIN UA: NEGATIVE
RBC UA: NEGATIVE
SPEC GRAV UA: 1.01
UROBILINOGEN UA: 0.2

## 2016-01-10 MED ORDER — ETODOLAC 500 MG PO TABS: 500.0000 mg | ORAL_TABLET | Freq: Two times a day (BID) | ORAL | Status: DC
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
## 2016-01-12 ENCOUNTER — Other Ambulatory Visit: Payer: Self-pay | Admitting: Family Medicine

## 2016-01-12 DIAGNOSIS — R9389 Abnormal findings on diagnostic imaging of other specified body structures: Secondary | ICD-10-CM

## 2016-01-12 DIAGNOSIS — R102 Pelvic and perineal pain: Secondary | ICD-10-CM

## 2016-01-14 ENCOUNTER — Telehealth: Payer: Self-pay | Admitting: Family Medicine

## 2016-01-14 NOTE — Telephone Encounter (Signed)
Referral was place on 01/12/16 back to Dr. Carola FrostHandy. Patient notified that he should contact them for appt. Nothing further needed.[place]

## 2016-01-14 NOTE — Telephone Encounter (Signed)
Pt asked if Dr. Juanetta GoslingHawkins had received the report from Dr. Carola FrostHandy about his pelvic xray.  His call back number is [phone]

## 2016-02-09 DIAGNOSIS — S32811D Multiple fractures of pelvis with unstable disruption of pelvic ring, subsequent encounter for fracture with routine healing: Secondary | ICD-10-CM | POA: Diagnosis not present

## 2016-02-09 DIAGNOSIS — S334XXD Traumatic rupture of symphysis pubis, subsequent encounter: Secondary | ICD-10-CM | POA: Diagnosis not present

## 2016-02-14 ENCOUNTER — Encounter: Payer: Self-pay | Admitting: Psychiatry

## 2016-02-14 ENCOUNTER — Other Ambulatory Visit: Payer: Self-pay | Admitting: Psychiatry

## 2016-02-14 ENCOUNTER — Ambulatory Visit (INDEPENDENT_AMBULATORY_CARE_PROVIDER_SITE_OTHER): Payer: Medicare Other | Admitting: Psychiatry

## 2016-02-14 VITALS — BP 123/76 | HR 91 | Temp 98.2°F | Ht 69.0 in | Wt 248.0 lb

## 2016-02-14 DIAGNOSIS — F331 Major depressive disorder, recurrent, moderate: Secondary | ICD-10-CM

## 2016-02-14 DIAGNOSIS — F332 Major depressive disorder, recurrent severe without psychotic features: Secondary | ICD-10-CM

## 2016-02-14 DIAGNOSIS — F988 Other specified behavioral and emotional disorders with onset usually occurring in childhood and adolescence: Secondary | ICD-10-CM

## 2016-02-14 DIAGNOSIS — F909 Attention-deficit hyperactivity disorder, unspecified type: Secondary | ICD-10-CM

## 2016-02-14 MED ORDER — AMPHETAMINE-DEXTROAMPHET ER 25 MG PO CP24: 25.0000 mg | ORAL_CAPSULE | Freq: Every day | ORAL | 0 refills | Status: DC

## 2016-02-14 MED ORDER — TRAZODONE HCL 100 MG PO TABS: 100.0000 mg | ORAL_TABLET | Freq: Every evening | ORAL | 1 refills | Status: DC | PRN

## 2016-02-14 MED ORDER — BUSPIRONE HCL 10 MG PO TABS: 10.0000 mg | ORAL_TABLET | Freq: Every day | ORAL | 1 refills | Status: DC

## 2016-02-14 MED ORDER — VENLAFAXINE HCL ER 150 MG PO CP24: 150.0000 mg | ORAL_CAPSULE | Freq: Every morning | ORAL | 1 refills | Status: DC
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
## 2016-03-07 ENCOUNTER — Encounter: Payer: Self-pay | Admitting: Family Medicine

## 2016-03-07 ENCOUNTER — Ambulatory Visit (INDEPENDENT_AMBULATORY_CARE_PROVIDER_SITE_OTHER): Payer: Medicare Other | Admitting: Family Medicine

## 2016-03-07 VITALS — BP 109/85 | HR 82 | Temp 98.7°F | Resp 16 | Ht 69.0 in | Wt 252.6 lb

## 2016-03-07 DIAGNOSIS — J40 Bronchitis, not specified as acute or chronic: Secondary | ICD-10-CM

## 2016-03-07 MED ORDER — DM-GUAIFENESIN ER 30-600 MG PO TB12: 1.0000 | ORAL_TABLET | Freq: Two times a day (BID) | ORAL | 1 refills | Status: DC

## 2016-03-07 MED ORDER — AZITHROMYCIN 250 MG PO TABS: ORAL_TABLET | ORAL | 0 refills | Status: DC

## 2016-03-07 NOTE — Progress Notes (Signed)
Name: Derrick Gutierrez   MRN: [id number]    DOB: 04-24-1981   Date:03/07/2016       Progress Note  Subjective  Chief Complaint  Chief Complaint  Patient presents with  . Cough    onset for 1 to 2 weeks    HPI Here c/o cough productive of white phlegm.  Present for 1-14 days.  No wheezing.  No fever.  Cough severe at times.  No problem-specific Assessment & Plan notes found for this encounter.   Past Medical History:  Diagnosis Date  . ADHD (attention deficit hyperactivity disorder)   . Anxiety   . Asthma   . Depression   . Seizures (HCC)     Social History  Substance Use Topics  . Smoking status: Never Smoker  . Smokeless tobacco: Never Used  . Alcohol use No     Current Outpatient Prescriptions:  .  amphetamine-dextroamphetamine (ADDERALL XR) 25 MG 24 hr capsule, Take 1 capsule by mouth daily. To be filled 01/20/16, Disp: 30 capsule, Rfl: 0 .  busPIRone (BUSPAR) 10 MG tablet, Take 1 tablet (10 mg total) by mouth at bedtime., Disp: 30 tablet, Rfl: 1 .  etodolac (LODINE) 500 MG tablet, Take 1 tablet (500 mg total) by mouth 2 (two) times daily., Disp: 60 tablet, Rfl: 6 .  fluticasone (FLONASE) 50 MCG/ACT nasal spray, Place 2 sprays into both nostrils daily., Disp: , Rfl: 12 .  ibuprofen (ADVIL,MOTRIN) 800 MG tablet, Take 1 tablet (800 mg total) by mouth every 8 (eight) hours as needed., Disp: 30 tablet, Rfl: 0 .  ketoconazole (NIZORAL) 2 % cream, Apply topically 2 (two) times daily., Disp: 60 g, Rfl: 3 .  loratadine (CLARITIN) 10 MG tablet, Take 1 tablet (10 mg total) by mouth daily., Disp: 30 tablet, Rfl: 0 .  PROVENTIL HFA 108 (90 Base) MCG/ACT inhaler, Reported on 09/14/2015, Disp: , Rfl: 3 .  traZODone (DESYREL) 100 MG tablet, Take 1 tablet (100 mg total) by mouth at bedtime as needed for sleep., Disp: 30 tablet, Rfl: 1 .  venlafaxine XR (EFFEXOR-XR) 150 MG 24 hr capsule, Take 1 capsule (150 mg total) by mouth every morning., Disp: 30 capsule, Rfl: 1 .  baclofen (LIORESAL)  10 MG tablet, Take 1 tablet (10 mg total) by mouth 3 (three) times daily as needed for muscle spasms. (Patient not taking: Reported on 03/07/2016), Disp: 30 each, Rfl: 0  Not on File  Review of Systems  Constitutional: Negative for chills, fever, malaise/fatigue and weight loss.  HENT: Positive for sore throat (mild). Negative for congestion.   Respiratory: Positive for cough and sputum production. Negative for shortness of breath and wheezing.   Cardiovascular: Negative for chest pain, palpitations and leg swelling.  Gastrointestinal: Negative for abdominal pain, blood in stool and heartburn.  Genitourinary: Negative for dysuria, frequency and urgency.  Musculoskeletal:       Pelvic girdle pain improving.  Skin: Negative for rash.  Neurological: Negative for weakness and headaches.      Objective  Vitals:   03/07/16 1452  BP: 109/85  Pulse: 82  Resp: 16  Temp: 98.7 F (37.1 C)  TempSrc: Oral  Weight: 252 lb 9.6 oz (114.6 kg)  Height: 5\' 9"  (1.753 m)     Physical Exam  Constitutional: He is well-developed, well-nourished, and in no distress. No distress.  HENT:  Head: Normocephalic and atraumatic.  Right Ear: External ear normal.  Left Ear: External ear normal.  Nose: Nose normal.  Mouth/Throat: Oropharynx is clear and moist.  Eyes: Conjunctivae and EOM are normal. Pupils are equal, round, and reactive to light. No scleral icterus.  Neck: Normal range of motion. Neck supple. No thyromegaly present.  Cardiovascular: Normal rate, regular rhythm and normal heart sounds.   Pulmonary/Chest: Effort normal. No respiratory distress. He has no wheezes. He has no rales.  Mild coarse breath sounds throughout.  Musculoskeletal: He exhibits no edema.  Lymphadenopathy:    He has no cervical adenopathy.  Vitals reviewed.     Recent Results (from the past 2160 hour(s))  POCT urinalysis dipstick     Status: None   Collection Time: 01/10/16  3:27 PM  Result Value Ref Range    Color, UA yellow    Clarity, UA clear    Glucose, UA neg    Bilirubin, UA neg    Ketones, UA neg    Spec Grav, UA 1.010    Blood, UA neg    pH, UA 6.5    Protein, UA neg    Urobilinogen, UA 0.2    Nitrite, UA neg    Leukocytes, UA Negative Negative     Assessment & Plan  1. Bronchitis  - azithromycin (ZITHROMAX) 250 MG tablet; Take 2 tabs on day one, then 1 tab daily on days two-five.  Dispense: 6 tablet; Refill: 0 - dextromethorphan-guaiFENesin (MUCINEX DM) 30-600 MG 12hr tablet; Take 1 tablet by mouth 2 (two) times daily. (OTC).  Dispense: 20 tablet; Refill: 1

## 2016-04-13 ENCOUNTER — Ambulatory Visit: Payer: Self-pay | Admitting: Family Medicine

## 2016-04-13 ENCOUNTER — Encounter: Payer: Self-pay | Admitting: Psychiatry

## 2016-04-13 ENCOUNTER — Other Ambulatory Visit: Payer: Self-pay | Admitting: Psychiatry

## 2016-04-13 ENCOUNTER — Ambulatory Visit (INDEPENDENT_AMBULATORY_CARE_PROVIDER_SITE_OTHER): Payer: 59 | Admitting: Psychiatry

## 2016-04-13 VITALS — BP 126/84 | HR 111 | Temp 98.5°F | Wt 245.2 lb

## 2016-04-13 DIAGNOSIS — F900 Attention-deficit hyperactivity disorder, predominantly inattentive type: Secondary | ICD-10-CM

## 2016-04-13 DIAGNOSIS — F332 Major depressive disorder, recurrent severe without psychotic features: Secondary | ICD-10-CM

## 2016-04-13 MED ORDER — TRAZODONE HCL 100 MG PO TABS: 100.0000 mg | ORAL_TABLET | Freq: Every evening | ORAL | 1 refills | Status: DC | PRN

## 2016-04-13 MED ORDER — AMPHETAMINE-DEXTROAMPHET ER 15 MG PO CP24: 15.0000 mg | ORAL_CAPSULE | Freq: Every day | ORAL | 0 refills | Status: DC

## 2016-04-13 MED ORDER — BUSPIRONE HCL 10 MG PO TABS: 10.0000 mg | ORAL_TABLET | Freq: Every day | ORAL | 1 refills | Status: DC

## 2016-04-13 MED ORDER — VENLAFAXINE HCL ER 150 MG PO CP24: 150.0000 mg | ORAL_CAPSULE | Freq: Every morning | ORAL | 1 refills | Status: DC
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
## 2016-04-17 ENCOUNTER — Ambulatory Visit: Payer: Self-pay | Admitting: Family Medicine

## 2016-04-20 ENCOUNTER — Ambulatory Visit: Payer: Self-pay | Admitting: Family Medicine

## 2016-04-28 ENCOUNTER — Other Ambulatory Visit: Payer: Self-pay | Admitting: Psychiatry

## 2016-04-28 DIAGNOSIS — F332 Major depressive disorder, recurrent severe without psychotic features: Secondary | ICD-10-CM

## 2016-05-12 ENCOUNTER — Ambulatory Visit: Payer: Self-pay | Admitting: Family Medicine

## 2016-05-22 ENCOUNTER — Other Ambulatory Visit: Payer: Self-pay | Admitting: Psychiatry

## 2016-05-22 ENCOUNTER — Encounter: Payer: Self-pay | Admitting: Psychiatry

## 2016-05-22 ENCOUNTER — Ambulatory Visit (INDEPENDENT_AMBULATORY_CARE_PROVIDER_SITE_OTHER): Payer: Medicare Other | Admitting: Psychiatry

## 2016-05-22 VITALS — BP 118/76 | HR 96 | Ht 69.0 in | Wt 249.8 lb

## 2016-05-22 DIAGNOSIS — F332 Major depressive disorder, recurrent severe without psychotic features: Secondary | ICD-10-CM | POA: Diagnosis not present

## 2016-05-22 DIAGNOSIS — F900 Attention-deficit hyperactivity disorder, predominantly inattentive type: Secondary | ICD-10-CM

## 2016-05-22 MED ORDER — TRAZODONE HCL 100 MG PO TABS: 100.0000 mg | ORAL_TABLET | Freq: Every evening | ORAL | 1 refills | Status: DC | PRN

## 2016-05-22 MED ORDER — AMPHETAMINE-DEXTROAMPHET ER 15 MG PO CP24: 15.0000 mg | ORAL_CAPSULE | Freq: Every day | ORAL | 0 refills | Status: DC

## 2016-05-22 MED ORDER — VENLAFAXINE HCL ER 150 MG PO CP24: 150.0000 mg | ORAL_CAPSULE | Freq: Every morning | ORAL | 1 refills | Status: DC

## 2016-05-22 NOTE — Telephone Encounter (Signed)
Pt has picked up refills
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
## 2016-06-05 ENCOUNTER — Other Ambulatory Visit: Payer: Self-pay | Admitting: Family Medicine

## 2016-06-07 ENCOUNTER — Ambulatory Visit (INDEPENDENT_AMBULATORY_CARE_PROVIDER_SITE_OTHER): Payer: 59 | Admitting: Licensed Clinical Social Worker

## 2016-06-07 DIAGNOSIS — F332 Major depressive disorder, recurrent severe without psychotic features: Secondary | ICD-10-CM | POA: Diagnosis not present

## 2016-06-07 DIAGNOSIS — F900 Attention-deficit hyperactivity disorder, predominantly inattentive type: Secondary | ICD-10-CM | POA: Diagnosis not present
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
## 2016-06-21 ENCOUNTER — Encounter: Payer: Self-pay | Admitting: Psychiatry

## 2016-06-21 ENCOUNTER — Ambulatory Visit (INDEPENDENT_AMBULATORY_CARE_PROVIDER_SITE_OTHER): Payer: 59 | Admitting: Psychiatry

## 2016-06-21 VITALS — BP 136/82 | HR 98 | Temp 98.4°F | Wt 249.8 lb

## 2016-06-21 DIAGNOSIS — F900 Attention-deficit hyperactivity disorder, predominantly inattentive type: Secondary | ICD-10-CM

## 2016-06-21 DIAGNOSIS — F332 Major depressive disorder, recurrent severe without psychotic features: Secondary | ICD-10-CM | POA: Diagnosis not present

## 2016-06-21 MED ORDER — AMPHETAMINE-DEXTROAMPHET ER 10 MG PO CP24: 10.0000 mg | ORAL_CAPSULE | Freq: Every day | ORAL | 0 refills | Status: DC

## 2016-06-21 MED ORDER — AMPHETAMINE-DEXTROAMPHET ER 15 MG PO CP24: 15.0000 mg | ORAL_CAPSULE | Freq: Every day | ORAL | 0 refills | Status: DC

## 2016-06-21 MED ORDER — TRAZODONE HCL 100 MG PO TABS: 100.0000 mg | ORAL_TABLET | Freq: Every evening | ORAL | 1 refills | Status: DC | PRN

## 2016-06-21 MED ORDER — BUSPIRONE HCL 10 MG PO TABS: 10.0000 mg | ORAL_TABLET | Freq: Every day | ORAL | 1 refills | Status: DC

## 2016-06-21 MED ORDER — VENLAFAXINE HCL ER 150 MG PO CP24: 150.0000 mg | ORAL_CAPSULE | Freq: Every morning | ORAL | 1 refills | Status: DC
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
## 2016-07-13 ENCOUNTER — Ambulatory Visit (INDEPENDENT_AMBULATORY_CARE_PROVIDER_SITE_OTHER): Payer: 59 | Admitting: Licensed Clinical Social Worker

## 2016-07-13 DIAGNOSIS — F332 Major depressive disorder, recurrent severe without psychotic features: Secondary | ICD-10-CM

## 2016-07-26 NOTE — Progress Notes (Signed)
   THERAPIST PROGRESS NOTE  Session Time: 60min  Participation Level: Active  Behavioral Response: CasualAlertEuphoric  Type of Therapy: Individual Therapy  Treatment Goals addressed: Coping  Interventions: CBT, Motivational Interviewing and Solution Focused  Summary: Derrick Gutierrez is a 36 y.o. male who presents with continued symptoms of his diagnosis.  LCSW discussed what psychotherapy is and is not and the importance of the therapeutic relationship to include open and honest communication between client and therapist and building trust.  Reviewed advantages and disadvantages of the therapeutic process and limitations to the therapeutic relationship including LCSW's role in maintaining the safety of the client, others and those in client's care.    Suicidal/Homicidal: No  Therapist Response:  Assessed pt current functioning per pt report.  Focused on rapport building w/ pt and exploring w/ pt his tx hx and what he is looking for in counseling currently.  Discussed current symptoms and focused on pt utilizing his strengths w/ typical struggles w/ winter months ahead.  Developed tx plan w/ pt.  Plan: Return again in 2weeks.  Diagnosis: Axis I: Major Depression, Recurrent severe    Axis II: No diagnosis    Marinda Elkicole M Paislyn Domenico, LCSW 07/12/2016

## 2016-08-01 ENCOUNTER — Ambulatory Visit: Payer: Self-pay | Admitting: Family Medicine

## 2016-08-14 ENCOUNTER — Ambulatory Visit (INDEPENDENT_AMBULATORY_CARE_PROVIDER_SITE_OTHER): Payer: Medicare Other | Admitting: Licensed Clinical Social Worker

## 2016-08-14 DIAGNOSIS — F332 Major depressive disorder, recurrent severe without psychotic features: Secondary | ICD-10-CM

## 2016-08-23 ENCOUNTER — Ambulatory Visit: Payer: Medicare Other | Admitting: Psychiatry
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
## 2016-09-05 ENCOUNTER — Ambulatory Visit: Payer: Self-pay | Admitting: Licensed Clinical Social Worker

## 2016-09-14 ENCOUNTER — Ambulatory Visit: Payer: Medicare Other | Admitting: Licensed Clinical Social Worker

## 2016-09-19 ENCOUNTER — Ambulatory Visit: Payer: Self-pay | Admitting: Family Medicine

## 2016-09-20 ENCOUNTER — Encounter: Payer: Self-pay | Admitting: Psychiatry

## 2016-09-20 ENCOUNTER — Ambulatory Visit (INDEPENDENT_AMBULATORY_CARE_PROVIDER_SITE_OTHER): Payer: 59 | Admitting: Psychiatry

## 2016-09-20 VITALS — BP 116/76 | HR 81 | Temp 97.8°F | Wt 253.0 lb

## 2016-09-20 DIAGNOSIS — F332 Major depressive disorder, recurrent severe without psychotic features: Secondary | ICD-10-CM | POA: Diagnosis not present

## 2016-09-20 DIAGNOSIS — F900 Attention-deficit hyperactivity disorder, predominantly inattentive type: Secondary | ICD-10-CM

## 2016-09-20 MED ORDER — VENLAFAXINE HCL ER 150 MG PO CP24: 150.0000 mg | ORAL_CAPSULE | Freq: Every morning | ORAL | 1 refills | Status: DC

## 2016-09-20 MED ORDER — BUSPIRONE HCL 10 MG PO TABS: 10.0000 mg | ORAL_TABLET | Freq: Two times a day (BID) | ORAL | 1 refills | Status: DC

## 2016-09-20 MED ORDER — TRAZODONE HCL 100 MG PO TABS: 100.0000 mg | ORAL_TABLET | Freq: Every evening | ORAL | 1 refills | Status: DC | PRN

## 2016-09-20 MED ORDER — AMPHETAMINE-DEXTROAMPHET ER 10 MG PO CP24: 10.0000 mg | ORAL_CAPSULE | Freq: Every day | ORAL | 0 refills | Status: DC
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
## 2016-09-22 ENCOUNTER — Encounter: Payer: Self-pay | Admitting: Physician Assistant

## 2016-09-22 ENCOUNTER — Ambulatory Visit (INDEPENDENT_AMBULATORY_CARE_PROVIDER_SITE_OTHER): Payer: Medicare Other | Admitting: Physician Assistant

## 2016-09-22 VITALS — BP 110/76 | HR 81 | Temp 98.2°F | Resp 16 | Ht 69.0 in | Wt 253.0 lb

## 2016-09-22 DIAGNOSIS — M79605 Pain in left leg: Secondary | ICD-10-CM | POA: Diagnosis not present
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
## 2016-09-22 NOTE — Patient Instructions (Signed)
Simple Pelvic Fracture, Adult °A pelvic fracture is a break in one of the pelvic bones. The pelvic bones include the bones that you sit on and the bones that make up the lower part of your spine. A pelvic fracture is called simple if the broken bones are stable and are not moving out of place. °What are the causes? °Common causes of this type of fracture include: °· A fall. °· A car accident. °· Force or pressure applied to the pelvis. ° °What increases the risk? °You may be at higher risk for this type of fracture if: °· You play high-impact sports. °· You are an older person with a condition that causes weak bones (osteoporosis). °· You have a bone-weakening disease. ° °What are the signs or symptoms? °Signs and symptoms may include: °· Tenderness, swelling, or bruising in the affected area. °· Pain when moving the hip. °· Pain when walking or standing. ° °How is this diagnosed? °A diagnosis is made with a physical exam and X-rays. Sometimes, a CT scan is also done. °How is this treated? °The goal of treatment is to get the bones to heal in a good position. Treatment of a simple pelvic fracture usually involves staying in bed (bed rest) and using crutches or a walker until the bones heal. Medicines may be prescribed for pain. Medicines may also be prescribed that help to prevent blood clots from forming in the legs. °Follow these instructions at home: °Managing pain, stiffness, and swelling °· If directed, apply ice to the injured area: °? Put ice in a plastic bag. °? Place a towel between your skin and the bag. °? Leave the ice on for 20 minutes, 2-3 times a day. °· Raise the injured area above the level of your heart while you are sitting or lying down. °Driving °· Do not  drive or operate heavy machinery until your health care provider tells you it is safe to do. °Activity °· Stay on bed rest for as long as directed by your health care provider. °· While on bed rest: °? Change the position of your legs every  1-2 hours. This keeps blood moving well through both of your legs. °? You may sit for as long as you feel comfortable. °· After bed rest: °? Avoid strenuous activities for as long as directed by your health care provider. °? Return to your normal activities as directed by your health care provider. Ask your health care provider what activities are safe for you. °Safety °· Do not use the injured limb to support your body weight until your health care provider says that you can. Use crutches or a walker as directed by your health care provider. °General instructions °· Do not use any tobacco products, including cigarettes, chewing tobacco, or electronic cigarettes. Tobacco can delay bone healing. If you need help quitting, ask your health care provider. °· Take medicines only as directed by your health care provider. °· Keep all follow-up visits as directed by your health care provider. This is important. °Contact a health care provider if: °· Your pain gets worse. °· Your pain is not relieved with medicines. °Get help right away if: °· You feel light-headed or faint. °· You develop chest pain. °· You develop shortness of breath. °· You have a fever. °· You have blood in your urine or your stools. °· You have vaginal bleeding. °· You have difficulty or pain with urination or with a bowel movement. °· You have difficulty or increased pain   with walking. °· You have new or increased swelling in one of your legs. °· You have numbness in your legs or groin area. °This information is not intended to replace advice given to you by your health care provider. Make sure you discuss any questions you have with your health care provider. °Document Released: 08/28/2001 Document Revised: 02/13/2016 Document Reviewed: 02/10/2014 °Elsevier Interactive Patient Education © 2017 [employer] ° °

## 2016-10-12 ENCOUNTER — Ambulatory Visit (INDEPENDENT_AMBULATORY_CARE_PROVIDER_SITE_OTHER): Payer: 59 | Admitting: Licensed Clinical Social Worker

## 2016-10-12 DIAGNOSIS — F332 Major depressive disorder, recurrent severe without psychotic features: Secondary | ICD-10-CM

## 2016-10-12 NOTE — Progress Notes (Signed)
   THERAPIST PROGRESS NOTE  Session Time:  Participation Level: Active  Behavioral Response: CasualAlertEuphoric  Type of Therapy: Individual Therapy  Treatment Goals addressed: Coping  Interventions: CBT, Motivational Interviewing and Solution Focused  Summary: Derrick Gutierrez is a 36 y.o. male who presents with continued symptoms of his diagnosis. [name] reports a favorable mood.  [name] vented his frustration about his personal and romantic life. Patient reports that he is no longer working at [employer] due to his "leg giving out."  Patient reports that he is unable to keep up with he fast pace at that job.  Patient reports that he starts orientation at [facility] on this coming Monday.  Patient denies any stressors emotionally.  Patient reports that he attributes his decrease in symptoms due to God and being biblical. He was able to list how his religion has assisted in his recovery.  Reports an increase in awareness such as bible scriptures in the home.  Reports that his [name] is a support that assists with "a clear mind."  Discussion of what a "clear mind" is.  Discussion of his mood and the weather.  Explored the correlation with him.    Suicidal/Homicidal: No  Therapist Response:  LCSW provided Patient with ongoing emotional support and encouragement.  Normalized feelings.  Commended Patient on progress and reinforced the importance of client staying focused onhis own strengths and resources and resiliency. Processed various strategies for dealing with stressors.    Plan: Return again in 2weeks.  Diagnosis: Axis I: Major Depression, Recurrent severe    Axis II: No diagnosis    [name], LCSW 10/12/2016

## 2016-11-10 ENCOUNTER — Ambulatory Visit: Payer: Medicare Other | Admitting: Licensed Clinical Social Worker

## 2016-11-20 ENCOUNTER — Ambulatory Visit (INDEPENDENT_AMBULATORY_CARE_PROVIDER_SITE_OTHER): Payer: 59 | Admitting: Psychiatry

## 2016-11-20 ENCOUNTER — Encounter: Payer: Self-pay | Admitting: Psychiatry

## 2016-11-20 VITALS — BP 125/82 | HR 80 | Temp 98.3°F | Wt 263.8 lb

## 2016-11-20 DIAGNOSIS — F332 Major depressive disorder, recurrent severe without psychotic features: Secondary | ICD-10-CM | POA: Diagnosis not present

## 2016-11-20 DIAGNOSIS — F900 Attention-deficit hyperactivity disorder, predominantly inattentive type: Secondary | ICD-10-CM | POA: Diagnosis not present

## 2016-11-20 MED ORDER — BUSPIRONE HCL 10 MG PO TABS: 10.0000 mg | ORAL_TABLET | Freq: Two times a day (BID) | ORAL | 1 refills | Status: DC

## 2016-11-20 MED ORDER — TRAZODONE HCL 100 MG PO TABS: 100.0000 mg | ORAL_TABLET | Freq: Every evening | ORAL | 1 refills | Status: DC | PRN

## 2016-11-20 MED ORDER — AMPHETAMINE-DEXTROAMPHET ER 5 MG PO CP24: 5.0000 mg | ORAL_CAPSULE | Freq: Every day | ORAL | 0 refills | Status: DC

## 2016-11-20 MED ORDER — VENLAFAXINE HCL ER 150 MG PO CP24: 150.0000 mg | ORAL_CAPSULE | Freq: Every morning | ORAL | 1 refills | Status: DC
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
## 2016-12-13 ENCOUNTER — Ambulatory Visit: Payer: Self-pay | Admitting: Family Medicine

## 2017-01-15 ENCOUNTER — Ambulatory Visit (INDEPENDENT_AMBULATORY_CARE_PROVIDER_SITE_OTHER): Payer: 59 | Admitting: Psychiatry

## 2017-01-15 ENCOUNTER — Encounter: Payer: Self-pay | Admitting: Psychiatry

## 2017-01-15 VITALS — BP 131/85 | HR 79 | Ht 68.75 in | Wt 259.0 lb

## 2017-01-15 DIAGNOSIS — F332 Major depressive disorder, recurrent severe without psychotic features: Secondary | ICD-10-CM

## 2017-01-15 DIAGNOSIS — F900 Attention-deficit hyperactivity disorder, predominantly inattentive type: Secondary | ICD-10-CM

## 2017-01-15 MED ORDER — TRAZODONE HCL 100 MG PO TABS: 100.0000 mg | ORAL_TABLET | Freq: Every evening | ORAL | 1 refills | Status: DC | PRN

## 2017-01-15 MED ORDER — AMPHETAMINE-DEXTROAMPHET ER 5 MG PO CP24: 5.0000 mg | ORAL_CAPSULE | Freq: Every day | ORAL | 0 refills | Status: DC

## 2017-01-15 MED ORDER — BUSPIRONE HCL 10 MG PO TABS: 10.0000 mg | ORAL_TABLET | Freq: Two times a day (BID) | ORAL | 1 refills | Status: DC

## 2017-01-15 MED ORDER — VENLAFAXINE HCL ER 150 MG PO CP24: 150.0000 mg | ORAL_CAPSULE | Freq: Every morning | ORAL | 1 refills | Status: DC
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
## 2017-03-12 ENCOUNTER — Ambulatory Visit: Payer: Medicare Other | Admitting: Psychiatry

## 2017-04-02 ENCOUNTER — Ambulatory Visit (INDEPENDENT_AMBULATORY_CARE_PROVIDER_SITE_OTHER): Payer: 59 | Admitting: Psychiatry

## 2017-04-02 ENCOUNTER — Encounter: Payer: Self-pay | Admitting: Psychiatry

## 2017-04-02 VITALS — BP 110/73 | HR 84 | Temp 98.0°F | Wt 269.6 lb

## 2017-04-02 DIAGNOSIS — F900 Attention-deficit hyperactivity disorder, predominantly inattentive type: Secondary | ICD-10-CM | POA: Diagnosis not present

## 2017-04-02 DIAGNOSIS — F332 Major depressive disorder, recurrent severe without psychotic features: Secondary | ICD-10-CM | POA: Diagnosis not present

## 2017-04-02 MED ORDER — TRAZODONE HCL 100 MG PO TABS: 100.0000 mg | ORAL_TABLET | Freq: Every evening | ORAL | 1 refills | Status: DC | PRN

## 2017-04-02 MED ORDER — VENLAFAXINE HCL ER 150 MG PO CP24
150.0000 mg | ORAL_CAPSULE | Freq: Every morning | ORAL | 1 refills | Status: DC
Start: 2017-04-02 — End: 2017-06-04

## 2017-04-02 MED ORDER — AMPHETAMINE-DEXTROAMPHET ER 5 MG PO CP24: 5.0000 mg | ORAL_CAPSULE | Freq: Every day | ORAL | 0 refills | Status: DC

## 2017-04-02 MED ORDER — BUSPIRONE HCL 10 MG PO TABS: 10.0000 mg | ORAL_TABLET | Freq: Two times a day (BID) | ORAL | 1 refills | Status: DC
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
## 2017-06-04 ENCOUNTER — Ambulatory Visit: Payer: Medicare Other | Admitting: Psychiatry

## 2017-06-04 ENCOUNTER — Encounter: Payer: Self-pay | Admitting: Psychiatry

## 2017-06-04 VITALS — BP 124/81 | HR 101 | Temp 98.1°F | Wt 280.4 lb

## 2017-06-04 DIAGNOSIS — F39 Unspecified mood [affective] disorder: Secondary | ICD-10-CM

## 2017-06-04 DIAGNOSIS — F332 Major depressive disorder, recurrent severe without psychotic features: Secondary | ICD-10-CM | POA: Diagnosis not present

## 2017-06-04 MED ORDER — BUSPIRONE HCL 10 MG PO TABS: 10.0000 mg | ORAL_TABLET | Freq: Two times a day (BID) | ORAL | 1 refills | Status: DC

## 2017-06-04 MED ORDER — VENLAFAXINE HCL ER 150 MG PO CP24: 150.0000 mg | ORAL_CAPSULE | Freq: Every morning | ORAL | 1 refills | Status: DC

## 2017-06-04 MED ORDER — TRAZODONE HCL 100 MG PO TABS: 100.0000 mg | ORAL_TABLET | Freq: Every evening | ORAL | 1 refills | Status: DC | PRN
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
## 2017-06-20 ENCOUNTER — Ambulatory Visit: Payer: Medicare Other | Admitting: Licensed Clinical Social Worker

## 2017-07-11 ENCOUNTER — Ambulatory Visit: Payer: Medicare Other | Admitting: Licensed Clinical Social Worker

## 2017-07-11 DIAGNOSIS — F39 Unspecified mood [affective] disorder: Secondary | ICD-10-CM

## 2017-07-12 NOTE — Progress Notes (Signed)
   THERAPIST PROGRESS NOTE  Session Time: 30min  Participation Level: Active  Behavioral Response: CasualAlertIrritable  Type of Therapy: Individual Therapy  Treatment Goals addressed: Coping  Interventions: Solution Focused and Supportive  Summary: Barbette Reichmannlliott Gosline is a 37 y.o. male who presents with continued symptoms of his diagnosis. Patient reports an "unfavorable" mood.  Patient reports that his mother in law has a stroke a few days ago.  He reports that she is currently hospitalized.  He reports that he has been supportive towards his wife.  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  He reports that he has struggled with relationships at work and being able to complete task given.  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  He reports that he is upset that his wife was not supportive when his father was sick or when he died.  He reports that she does not want him spending time with his family.  He abruptly left the session and stated "I will see you next month."   Suicidal/Homicidal: No  Therapist Response: Assisted patient with his current mood.  Discussed current stressors along with progress and regression.  Encouraged Patient to discuss his feelings about his Mother in Law being in the hospital.  Prompted Patient to discuss pros and cons of various jobs and to give a short list of his ideal job.  LCSW provided Patient with ongoing emotional support and encouragement.  Normalized feelings.  Commended Patient on progress and reinforced the importance of client staying focused on his own strengths and resources and resiliency. Processed various strategies for dealing with stressors.    Plan: Return again in2 weeks.  Diagnosis: Axis I: Mood Disorder    Axis II: No diagnosis    Marinda Elkicole M Hannahmarie Asberry, LCSW 07/12/2017

## 2017-09-03 ENCOUNTER — Other Ambulatory Visit: Payer: Self-pay | Admitting: Psychiatry

## 2017-09-03 ENCOUNTER — Encounter: Payer: Self-pay | Admitting: Psychiatry

## 2017-09-03 ENCOUNTER — Ambulatory Visit (INDEPENDENT_AMBULATORY_CARE_PROVIDER_SITE_OTHER): Payer: Medicare Other | Admitting: Psychiatry

## 2017-09-03 VITALS — BP 110/68 | HR 85 | Ht 68.75 in | Wt 284.0 lb

## 2017-09-03 DIAGNOSIS — F332 Major depressive disorder, recurrent severe without psychotic features: Secondary | ICD-10-CM | POA: Diagnosis not present

## 2017-09-03 DIAGNOSIS — F900 Attention-deficit hyperactivity disorder, predominantly inattentive type: Secondary | ICD-10-CM

## 2017-09-03 DIAGNOSIS — F39 Unspecified mood [affective] disorder: Secondary | ICD-10-CM

## 2017-09-03 MED ORDER — TRAZODONE HCL 100 MG PO TABS: 100.0000 mg | ORAL_TABLET | Freq: Every evening | ORAL | 1 refills | Status: DC | PRN

## 2017-09-03 MED ORDER — VENLAFAXINE HCL ER 150 MG PO CP24: 150.0000 mg | ORAL_CAPSULE | Freq: Every morning | ORAL | 1 refills | Status: DC

## 2017-09-03 MED ORDER — ATOMOXETINE HCL 40 MG PO CAPS: 40.0000 mg | ORAL_CAPSULE | Freq: Every day | ORAL | 1 refills | Status: DC
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
## 2017-09-19 ENCOUNTER — Ambulatory Visit (INDEPENDENT_AMBULATORY_CARE_PROVIDER_SITE_OTHER): Payer: Medicare Other | Admitting: Licensed Clinical Social Worker

## 2017-09-19 DIAGNOSIS — F39 Unspecified mood [affective] disorder: Secondary | ICD-10-CM

## 2017-10-08 ENCOUNTER — Ambulatory Visit: Payer: Medicare Other | Admitting: Psychiatry

## 2017-10-15 ENCOUNTER — Other Ambulatory Visit: Payer: Self-pay

## 2017-10-15 ENCOUNTER — Other Ambulatory Visit: Payer: Self-pay | Admitting: Psychiatry

## 2017-10-15 ENCOUNTER — Ambulatory Visit: Payer: Medicare Other | Admitting: Psychiatry

## 2017-10-15 ENCOUNTER — Encounter: Payer: Self-pay | Admitting: Psychiatry

## 2017-10-15 ENCOUNTER — Ambulatory Visit: Payer: Self-pay | Admitting: Psychiatry

## 2017-10-15 VITALS — BP 130/65 | HR 93 | Temp 98.3°F | Wt 299.4 lb

## 2017-10-15 DIAGNOSIS — F39 Unspecified mood [affective] disorder: Secondary | ICD-10-CM

## 2017-10-15 DIAGNOSIS — F900 Attention-deficit hyperactivity disorder, predominantly inattentive type: Secondary | ICD-10-CM | POA: Diagnosis not present

## 2017-10-15 DIAGNOSIS — F332 Major depressive disorder, recurrent severe without psychotic features: Secondary | ICD-10-CM | POA: Diagnosis not present

## 2017-10-15 MED ORDER — VENLAFAXINE HCL ER 150 MG PO CP24: 150.0000 mg | ORAL_CAPSULE | Freq: Every morning | ORAL | 1 refills | Status: DC

## 2017-10-15 MED ORDER — ATOMOXETINE HCL 40 MG PO CAPS: 40.0000 mg | ORAL_CAPSULE | Freq: Every day | ORAL | 1 refills | Status: DC

## 2017-10-15 MED ORDER — TRAZODONE HCL 100 MG PO TABS: 100.0000 mg | ORAL_TABLET | Freq: Every evening | ORAL | 1 refills | Status: DC | PRN
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
## 2017-10-18 ENCOUNTER — Ambulatory Visit: Payer: Medicare Other | Admitting: Licensed Clinical Social Worker

## 2017-10-18 DIAGNOSIS — F39 Unspecified mood [affective] disorder: Secondary | ICD-10-CM

## 2017-10-18 NOTE — Progress Notes (Signed)
  THERAPIST PROGRESS NOTE   Date of Service:   09/19/2017  Session Time:   1 hour  Patient:   Derrick Gutierrez   DOB:   04-10-1981  MR Number:  [id number]  [facility]  [facility] [address] [place] [address] Dept: [phone]            Provider/Observer:  Marinda ElkNicole M Andalyn Heckstall Counselor  Risk of Suicide/Violence: low   Diagnosis:    Mood disorder (HCC)  Type of Therapy: Individual Therapy  Treatment Goals addressed: Coping  Participation Level: Active   Interventions: CBT and Motivational Interviewing   Behavioral Response: CasualAlertEuthymic   Summary: Engaged Patient in discussion about his current mood and symptoms that he is current experiencing.  Facilitated a discussion with his relationship with his wife. Explored employers and reasoning for dismissal from each.  Discussion of expectations of the employers and his expectations.   Shifted focus of discussion on medication management.      Plan: Derrick Gutierrez will continue to manage stressors by using coping skills.   Return again in 2 weeks.
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
## 2017-11-10 ENCOUNTER — Other Ambulatory Visit: Payer: Self-pay | Admitting: Psychiatry

## 2017-11-10 DIAGNOSIS — F332 Major depressive disorder, recurrent severe without psychotic features: Secondary | ICD-10-CM

## 2017-11-29 ENCOUNTER — Ambulatory Visit: Payer: Medicare Other | Admitting: Licensed Clinical Social Worker

## 2017-11-29 DIAGNOSIS — F39 Unspecified mood [affective] disorder: Secondary | ICD-10-CM | POA: Diagnosis not present

## 2017-12-04 ENCOUNTER — Encounter: Payer: Self-pay | Admitting: Family Medicine

## 2017-12-04 ENCOUNTER — Ambulatory Visit (INDEPENDENT_AMBULATORY_CARE_PROVIDER_SITE_OTHER): Payer: Medicare Other | Admitting: Family Medicine

## 2017-12-04 VITALS — BP 130/66 | HR 81 | Temp 97.9°F | Resp 16 | Ht 69.0 in | Wt 290.0 lb

## 2017-12-04 DIAGNOSIS — J019 Acute sinusitis, unspecified: Secondary | ICD-10-CM | POA: Diagnosis not present

## 2017-12-04 DIAGNOSIS — J4520 Mild intermittent asthma, uncomplicated: Secondary | ICD-10-CM

## 2017-12-04 DIAGNOSIS — R05 Cough: Secondary | ICD-10-CM | POA: Diagnosis not present

## 2017-12-04 DIAGNOSIS — F3341 Major depressive disorder, recurrent, in partial remission: Secondary | ICD-10-CM | POA: Diagnosis not present

## 2017-12-04 DIAGNOSIS — R059 Cough, unspecified: Secondary | ICD-10-CM

## 2017-12-04 MED ORDER — IPRATROPIUM BROMIDE 0.06 % NA SOLN: 2.0000 | Freq: Four times a day (QID) | NASAL | 0 refills | Status: DC

## 2017-12-04 MED ORDER — AZITHROMYCIN 250 MG PO TABS: ORAL_TABLET | ORAL | 0 refills | Status: DC

## 2017-12-04 MED ORDER — BENZONATATE 100 MG PO CAPS: 100.0000 mg | ORAL_CAPSULE | Freq: Three times a day (TID) | ORAL | 0 refills | Status: DC | PRN

## 2017-12-04 NOTE — Patient Instructions (Addendum)
Thank you for coming to the office today.  1. It sounds like you had an Upper Respiratory Virus that has settled into a Bronchitis, lower respiratory tract infection. I don't have concerns for pneumonia today, and think that this should gradually improve. Once you are feeling better, the cough may take a few weeks to fully resolve.   start Azithromycin Z pak (antibiotic) 2 tabs day 1, then 1 tab x 4 days, complete entire course even if improved  Start Tessalon Perls take 1 capsule up to 3 times a day as needed for cough  Start Atrovent nasal spray decongestant 2 sprays in each nostril up to 4 times daily for 7 days  May try OTC Mucinex (or may try Mucinex-DM for cough) up to 7-10 days then stop  - Use Albuterol inhaler 2 puffs every 4-6 hours around the clock for next 2-3 days, max up to 5 days then use as needed   IF NOT IMPROVING - OR WORSE COUGH WHEEZING - Concern for asthma then call office and we can send  Prednisone 50mg  daily for next 5 days - this will open up lungs allow you to breath better and treat that wheezing or bronchospasm  - Use nasal saline (Simply Saline or Ocean Spray) to flush nasal congestion multiple times a day, may help cough - Drink plenty of fluids to improve congestion  If your symptoms seem to worsen instead of improve over next several days, including significant fever / chills, worsening shortness of breath, worsening wheezing, or nausea / vomiting and can't take medicines - return sooner or go to hospital Emergency Department for more immediate treatment.   Please schedule a Follow-up Appointment to: Return in about 1 week (around 12/11/2017), or if symptoms worsen or fail to improve, for URI.  If you have any other questions or concerns, please feel free to call the office or send a message through MyChart. You may also schedule an earlier appointment if necessary.  Additionally, you may be receiving a survey about your experience at our office within a few  days to 1 week by e-mail or mail. We value your feedback.  Saralyn PilarAlexander Takako Minckler, DO [facility]

## 2017-12-04 NOTE — Progress Notes (Addendum)
Subjective:    Patient ID: Derrick Gutierrez, male    DOB: Apr 02, 1981, 37 y.o.   MRN: [id number]  Harish Bram is a 37 y.o. male presenting on 12/04/2017 for Cough (nasal congestion onset week)  HPI   ACUTE SINUSITIS / COUGH / COLD Reports symptoms started about 1 week ago with chest cold with drainage post nasal and cough, also has some persistent sinus congestion, he has some mild productive cough at times, does not have coughing spells at night or does not wake up at night. He has tried OTC Sudafed, Robitussin, Benadryl as needed with temporary relief. He has history of asthma, and tried existing Albuterol inhaler PRN with some temporary relief. - Admits chest tightness with congestion - admits sinus drainage and congestion - Sick contact with wife, similar symptoms - Denies any fevers or chills, nausea vomiting, body aches or muscle aches, ear pain or pressure  Major Depression, ADHD Followed by Dr Garnetta Buddy, [facility] and Nolon Rod LCSW Last seen 10/2017, reports doing well on current regimen, with Venlafaxine XR 150mg  daily, Buspar 10mg , Trazodone 100mg  nightly PRN Denies worsening mood, insomnia, anxiety, suicidal or homicidal ideation   Depression screen [facility] 2/9 12/04/2017 03/07/2016 08/10/2015  Decreased Interest 0 3 0  Down, Depressed, Hopeless 1 1 0  PHQ - 2 Score 1 4 0  Altered sleeping 0 0 -  Tired, decreased energy 0 2 -  Change in appetite 1 0 -  Feeling bad or failure about yourself  0 2 -  Trouble concentrating 0 1 -  Moving slowly or fidgety/restless 0 0 -  Suicidal thoughts 0 0 -  PHQ-9 Score 2 9 -  Difficult doing work/chores Not difficult at all Not difficult at all -    Social History   Tobacco Use  . Smoking status: Never Smoker  . Smokeless tobacco: Never Used  Substance Use Topics  . Alcohol use: No    Alcohol/week: 0.0 oz  . Drug use: No    Review of Systems Per HPI unless specifically indicated above     Objective:    BP 130/66    Pulse 81   Temp 97.9 F (36.6 C) (Oral)   Resp 16   Wt 290 lb (131.5 kg)   SpO2 100%   BMI 43.14 kg/m   Wt Readings from Last 3 Encounters:  12/04/17 290 lb (131.5 kg)  10/15/17 299 lb 6.4 oz (135.8 kg)  09/03/17 284 lb (128.8 kg)    Physical Exam  Constitutional: He is oriented to person, place, and time. He appears well-developed and well-nourished. No distress.  Well-appearing, comfortable, cooperative, obese  HENT:  Head: Normocephalic and atraumatic.  Mouth/Throat: Oropharynx is clear and moist.  Frontal / maxillary sinuses non-tender. Nares with turbinate edema with congestion without purulence. Bilateral TMs clear without erythema, effusion or bulging. Oropharynx clear without erythema, exudates, edema or asymmetry.  Eyes: Conjunctivae are normal. Right eye exhibits no discharge. Left eye exhibits no discharge.  Neck: Normal range of motion. Neck supple. No thyromegaly present.  Cardiovascular: Normal rate, regular rhythm, normal heart sounds and intact distal pulses.  No murmur heard. Pulmonary/Chest: Effort normal. No respiratory distress. He has no wheezes. He has no rales.  Mild rhonchi lower bases clear with cough. Occasional coughing. Mild reduced air movement with large body habitus. No obvious wheezing or focal crackles  Musculoskeletal: Normal range of motion. He exhibits no edema.  Lymphadenopathy:    He has no cervical adenopathy.  Neurological: He is alert and  oriented to person, place, and time.  Skin: Skin is warm and dry. No rash noted. He is not diaphoretic. No erythema.  Psychiatric: He has a normal mood and affect. His behavior is normal.  Well groomed, good eye contact, normal speech and thoughts  Nursing note and vitals reviewed.  Results for orders placed or performed in visit on 01/10/16  POCT urinalysis dipstick  Result Value Ref Range   Color, UA yellow    Clarity, UA clear    Glucose, UA neg    Bilirubin, UA neg    Ketones, UA neg    Spec  Grav, UA 1.010    Blood, UA neg    pH, UA 6.5    Protein, UA neg    Urobilinogen, UA 0.2    Nitrite, UA neg    Leukocytes, UA Negative Negative      Assessment & Plan:   Problem List Items Addressed This Visit    Asthma, mild intermittent   Cough   Relevant Medications   benzonatate (TESSALON) 100 MG capsule   Depression, major, recurrent, in partial remission (HCC)    Currently stable with partial remission, mostly controlled Followed by Dr Garnetta BuddyFaheem and [facility]Nicole [facility]ac[facility] Controlled on current med regimen      Relevant Medications   busPIRone (BUSPAR) 10 MG tablet    Other Visit Diagnoses    Acute non-recurrent sinusitis, unspecified location    -  Primary   Relevant Medications   azithromycin (ZITHROMAX Z-PAK) 250 MG tablet   benzonatate (TESSALON) 100 MG capsule   ipratropium (ATROVENT) 0.06 % nasal spray     Consistent with worsening cough in setting of persistent sinusitis >1 week duration, now worsening, possible bacterial etiology given progression. Without known sick contact. Concern w/ history of asthma, without obvious wheezing currently, but inc cough at risk of flare  Plan: 1. Start Azithromycin Z-pak dosing 500mg  then 250mg  daily x 4 days - HOLD prednisone for now - will defer unless return with asthma flare - Use Albuterol inhaler 2 puffs q 4-6 hour x 3-5 days regularly - if needed - Start Atrovent nasal spray decongestant 2 sprays in each nostril up to 4 times daily for 7 days - Start Northwest Airlinesessalon Perls take 1 capsule up to 3 times a day as needed for cough - Continue antihistamine - May try OTC Mucinex (or may try Mucinex-DM for cough) up to 7-10 days then stop Return criteria reviewed, follow-up within 1 week if not improved    Meds ordered this encounter  Medications  . azithromycin (ZITHROMAX Z-PAK) 250 MG tablet    Sig: Take 2 tabs (500mg  total) on Day 1. Take 1 tab (250mg ) daily for next 4 days.    Dispense:  6 tablet    Refill:  0  .  benzonatate (TESSALON) 100 MG capsule    Sig: Take 1 capsule (100 mg total) by mouth 3 (three) times daily as needed for cough.    Dispense:  30 capsule    Refill:  0  . ipratropium (ATROVENT) 0.06 % nasal spray    Sig: Place 2 sprays into both nostrils 4 (four) times daily. For up to 5-7 days then stop.    Dispense:  15 mL    Refill:  0     Follow up plan: Return in about 1 week (around 12/11/2017), or if symptoms worsen or fail to improve, for URI.  Saralyn PilarAlexander Lania Zawistowski, DO [facility] 12/04/2017, 10:18  AM

## 2017-12-05 NOTE — Assessment & Plan Note (Signed)
Currently stable with partial remission, mostly controlled Followed by Dr Garnetta BuddyFaheem and [facility]olon [facility]Nicole [facility]ac[facility] Controlled on current med regimen

## 2017-12-10 ENCOUNTER — Ambulatory Visit (INDEPENDENT_AMBULATORY_CARE_PROVIDER_SITE_OTHER): Payer: Medicare Other | Admitting: Psychiatry

## 2017-12-10 ENCOUNTER — Other Ambulatory Visit: Payer: Self-pay

## 2017-12-10 ENCOUNTER — Other Ambulatory Visit: Payer: Self-pay | Admitting: Psychiatry

## 2017-12-10 ENCOUNTER — Encounter: Payer: Self-pay | Admitting: Psychiatry

## 2017-12-10 VITALS — BP 127/79 | HR 114 | Temp 98.3°F | Wt 290.8 lb

## 2017-12-10 DIAGNOSIS — F39 Unspecified mood [affective] disorder: Secondary | ICD-10-CM | POA: Diagnosis not present

## 2017-12-10 DIAGNOSIS — F900 Attention-deficit hyperactivity disorder, predominantly inattentive type: Secondary | ICD-10-CM

## 2017-12-10 DIAGNOSIS — F332 Major depressive disorder, recurrent severe without psychotic features: Secondary | ICD-10-CM

## 2017-12-10 MED ORDER — VENLAFAXINE HCL ER 150 MG PO CP24: 150.0000 mg | ORAL_CAPSULE | Freq: Every morning | ORAL | 1 refills | Status: DC

## 2017-12-10 MED ORDER — BUSPIRONE HCL 10 MG PO TABS: 10.0000 mg | ORAL_TABLET | ORAL | 1 refills | Status: DC

## 2017-12-10 MED ORDER — ATOMOXETINE HCL 60 MG PO CAPS: 60.0000 mg | ORAL_CAPSULE | Freq: Every day | ORAL | 1 refills | Status: DC
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
## 2018-01-01 NOTE — Progress Notes (Signed)
   THERAPIST PROGRESS NOTE  Session Time: 60min  Participation Level: Active  Behavioral Response: CasualAlertEuthymic  Type of Therapy: Individual Therapy  Treatment Goals addressed: Coping  Interventions: CBT and Motivational Interviewing  Summary: Derrick Gutierrez is a 37 y.o. male who presents with a reduction in his symptoms.  Patient reports a "favorable" mood.  Patient reports that he is still working and is enjoying his job. Patient reports that he and his wife continue to struggle with communication.  Role Played with patient how to develop a conversation.  Suicidal/Homicidal: No  Plan: Return again in 2 weeks.  Diagnosis: Axis I: Mood Disorder NOS    Axis II: No diagnosis    Marinda Elkicole M Khameron Gruenwald, LCSW 11/29/2017

## 2018-01-14 ENCOUNTER — Ambulatory Visit: Payer: Medicare Other | Admitting: Licensed Clinical Social Worker

## 2018-01-21 ENCOUNTER — Ambulatory Visit: Payer: Self-pay | Admitting: Psychiatry

## 2018-02-04 ENCOUNTER — Other Ambulatory Visit: Payer: Self-pay

## 2018-02-04 ENCOUNTER — Encounter: Payer: Self-pay | Admitting: Psychiatry

## 2018-02-04 ENCOUNTER — Ambulatory Visit (INDEPENDENT_AMBULATORY_CARE_PROVIDER_SITE_OTHER): Payer: Medicare Other | Admitting: Psychiatry

## 2018-02-04 VITALS — BP 131/78 | HR 111 | Temp 98.2°F | Wt 300.4 lb

## 2018-02-04 DIAGNOSIS — F39 Unspecified mood [affective] disorder: Secondary | ICD-10-CM | POA: Diagnosis not present

## 2018-02-04 DIAGNOSIS — F900 Attention-deficit hyperactivity disorder, predominantly inattentive type: Secondary | ICD-10-CM | POA: Diagnosis not present

## 2018-02-04 DIAGNOSIS — F332 Major depressive disorder, recurrent severe without psychotic features: Secondary | ICD-10-CM | POA: Diagnosis not present

## 2018-02-04 MED ORDER — TRAZODONE HCL 100 MG PO TABS: 100.0000 mg | ORAL_TABLET | Freq: Every evening | ORAL | 1 refills | Status: DC | PRN

## 2018-02-04 MED ORDER — BUSPIRONE HCL 10 MG PO TABS: 10.0000 mg | ORAL_TABLET | ORAL | 1 refills | Status: DC

## 2018-02-04 MED ORDER — VENLAFAXINE HCL ER 150 MG PO CP24: 150.0000 mg | ORAL_CAPSULE | Freq: Every morning | ORAL | 1 refills | Status: DC

## 2018-02-04 MED ORDER — ATOMOXETINE HCL 60 MG PO CAPS: 60.0000 mg | ORAL_CAPSULE | Freq: Every day | ORAL | 1 refills | Status: DC
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
## 2018-02-14 ENCOUNTER — Ambulatory Visit: Payer: Medicare Other | Admitting: Licensed Clinical Social Worker

## 2018-03-14 ENCOUNTER — Ambulatory Visit (INDEPENDENT_AMBULATORY_CARE_PROVIDER_SITE_OTHER): Payer: Medicare Other | Admitting: Licensed Clinical Social Worker

## 2018-03-14 DIAGNOSIS — F332 Major depressive disorder, recurrent severe without psychotic features: Secondary | ICD-10-CM | POA: Diagnosis not present

## 2018-03-14 NOTE — Progress Notes (Signed)
   THERAPIST PROGRESS NOTE  Session Time: 46mn  Participation Level: Active  Behavioral Response: CasualAlertEuthymic  Type of Therapy: Individual Therapy  Treatment Goals addressed: Communication: how to communicate with his wife  Interventions: Motivational Interviewing  Summary: EMyrtle Barnhardis a 37y.o. male who presents with continued symptoms of his diagnosis.  Therapist met with Patient in an outpatient setting to assess current mood and assist with making progress towards goals through the use of therapeutic intervention. Therapist did a brief mood check, assessing anger, fear, disgust, excitement, happiness, and sadness.  Patient reports a "poor" mood.  Patient reports that he and his wife had a heated marriage counseling session yesterday.  Patient wants to learn how to communicate with his wife.  Patient denies having conversations with her outside of "How was your day?"  Reviewed with Patient Communication techniques from [employer]MaternityMeds.de  Assisted with understanding the article.  At the end of the session, Patient was upbeat and confident    Suicidal/Homicidal: No  Plan: Return again in 2 weeks.  Diagnosis: Axis I: Major Depression, Recurrent severe    Axis II: No diagnosis    NLubertha South LCSW 03/14/2018

## 2018-03-27 ENCOUNTER — Ambulatory Visit (INDEPENDENT_AMBULATORY_CARE_PROVIDER_SITE_OTHER): Payer: Medicare Other | Admitting: Licensed Clinical Social Worker

## 2018-03-27 DIAGNOSIS — F332 Major depressive disorder, recurrent severe without psychotic features: Secondary | ICD-10-CM

## 2018-03-27 NOTE — Progress Notes (Signed)
   THERAPIST PROGRESS NOTE  Session Time: 30  Participation Level: Active  Behavioral Response: CasualAlertTired  Type of Therapy: Individual Therapy  Treatment Goals addressed: Communication: with peers  Interventions: Solution Focused  Summary: Derrick Gutierrez is a 37 y.o. male who presents with a reduction in symptoms.  Patient reports that he is doing better but has been tired lately.  Discussion of his ability to follow through with previous recommendation and how he felt communicating.  Reminded patient that things will get better but he has to put in the work.  During session Patient fell asleep.  Ended session after 19 minutes. Gave homework.  Suicidal/Homicidal: No  Plan: Return again in2 weeks.  Diagnosis: Axis I: Mood Disorder NOS    Axis II: No diagnosis    [name], LCSW 03/27/2018

## 2018-04-10 ENCOUNTER — Ambulatory Visit (INDEPENDENT_AMBULATORY_CARE_PROVIDER_SITE_OTHER): Payer: Medicare Other | Admitting: Licensed Clinical Social Worker

## 2018-04-10 DIAGNOSIS — F332 Major depressive disorder, recurrent severe without psychotic features: Secondary | ICD-10-CM | POA: Diagnosis not present

## 2018-04-10 NOTE — Progress Notes (Signed)
   THERAPIST PROGRESS NOTE  Session Time: 3mn  Participation Level: Active  Behavioral Response: CasualAlertEuthymic  Type of Therapy: Individual Therapy  Treatment Goals addressed: Coping and Diagnosis: Depression  Interventions: CBT and Motivational Interviewing  Summary: Derrick Kornegayis a 37y.o. male who presents with a decrease in symptoms. Therapist met with Patient in an outpatient setting to assess current mood and assist with making progress towards goals through the use of therapeutic intervention. Therapist did a brief mood check, assessing anger, fear, disgust, excitement, happiness, and sadness. Patient reports a "favorable" mood.  Patient reports that he attempted a few of the techniques that were discussed last visit.  He report that he and his wife have began communicating more effectively.  He reports that he struggles with telling his mother "no."  Explored expectations of his mother, his wife and himself.    Suicidal/Homicidal: No  Plan: Return again in 2 weeks.  Diagnosis: Axis I: Mood Disorder    Axis II: No diagnosis    NLubertha South LCSW 04/10/2018

## 2018-04-23 ENCOUNTER — Ambulatory Visit (INDEPENDENT_AMBULATORY_CARE_PROVIDER_SITE_OTHER): Payer: Medicare Other | Admitting: Licensed Clinical Social Worker

## 2018-04-23 DIAGNOSIS — F332 Major depressive disorder, recurrent severe without psychotic features: Secondary | ICD-10-CM

## 2018-04-29 NOTE — Progress Notes (Signed)
   THERAPIST PROGRESS NOTE  Session Time: 34mn  Participation Level: Minimal  Behavioral Response: CasualAlertIrritable  Type of Therapy: Individual Therapy  Treatment Goals addressed: Coping  Interventions: Motivational Interviewing  Summary: Derrick Bruschiis a 37y.o. male who presents with symptoms of diagnosis.  Therapist met with Patient in an outpatient setting to assess current mood and assist with making progress towards goals through the use of therapeutic intervention. Therapist did a brief mood check, assessing anger, fear, disgust, excitement, happiness, and sadness.  Patient reports "unfavorable" mood.  Patient reports that he and his wife moved into their home.  Reports that he moved without assistance of others.  Reports being frustration with his wife's expectations.  Discussion of his expectations in the relationship opposed to her expectations.    Suicidal/Homicidal: No  Plan: Return again in 2weeks.  Diagnosis: Axis I: Mood Disorder NOS    Axis II: No diagnosis    NLubertha South LCSW 04/23/2018

## 2018-05-06 ENCOUNTER — Ambulatory Visit: Payer: Medicare Other | Admitting: Licensed Clinical Social Worker

## 2018-05-07 ENCOUNTER — Ambulatory Visit: Payer: Self-pay | Admitting: Psychiatry

## 2018-05-20 ENCOUNTER — Ambulatory Visit (INDEPENDENT_AMBULATORY_CARE_PROVIDER_SITE_OTHER): Payer: Medicare Other | Admitting: Family Medicine

## 2018-05-20 ENCOUNTER — Encounter: Payer: Self-pay | Admitting: Family Medicine

## 2018-05-20 ENCOUNTER — Other Ambulatory Visit: Payer: Self-pay

## 2018-05-20 ENCOUNTER — Ambulatory Visit (INDEPENDENT_AMBULATORY_CARE_PROVIDER_SITE_OTHER): Payer: Medicare Other | Admitting: Psychiatry

## 2018-05-20 ENCOUNTER — Encounter: Payer: Self-pay | Admitting: Psychiatry

## 2018-05-20 VITALS — BP 135/84 | HR 98 | Temp 98.9°F | Wt 282.4 lb

## 2018-05-20 VITALS — BP 120/83 | HR 126 | Temp 97.7°F | Resp 16 | Ht 69.0 in | Wt 284.0 lb

## 2018-05-20 DIAGNOSIS — J4520 Mild intermittent asthma, uncomplicated: Secondary | ICD-10-CM

## 2018-05-20 DIAGNOSIS — F332 Major depressive disorder, recurrent severe without psychotic features: Secondary | ICD-10-CM | POA: Diagnosis not present

## 2018-05-20 DIAGNOSIS — J3089 Other allergic rhinitis: Secondary | ICD-10-CM

## 2018-05-20 DIAGNOSIS — J0100 Acute maxillary sinusitis, unspecified: Secondary | ICD-10-CM

## 2018-05-20 DIAGNOSIS — F900 Attention-deficit hyperactivity disorder, predominantly inattentive type: Secondary | ICD-10-CM | POA: Diagnosis not present

## 2018-05-20 MED ORDER — AZITHROMYCIN 250 MG PO TABS: ORAL_TABLET | ORAL | 0 refills | Status: DC

## 2018-05-20 MED ORDER — BUSPIRONE HCL 10 MG PO TABS: 10.0000 mg | ORAL_TABLET | ORAL | 1 refills | Status: DC

## 2018-05-20 MED ORDER — FLUTICASONE PROPIONATE 50 MCG/ACT NA SUSP: 2.0000 | Freq: Every day | NASAL | 3 refills | Status: DC

## 2018-05-20 MED ORDER — VENLAFAXINE HCL ER 150 MG PO CP24: 150.0000 mg | ORAL_CAPSULE | Freq: Every morning | ORAL | 1 refills | Status: DC

## 2018-05-20 MED ORDER — TRAZODONE HCL 100 MG PO TABS: 100.0000 mg | ORAL_TABLET | Freq: Every evening | ORAL | 1 refills | Status: DC | PRN

## 2018-05-20 MED ORDER — ATOMOXETINE HCL 80 MG PO CAPS: 80.0000 mg | ORAL_CAPSULE | Freq: Every day | ORAL | 2 refills | Status: DC

## 2018-05-20 MED ORDER — BENZONATATE 100 MG PO CAPS: 100.0000 mg | ORAL_CAPSULE | Freq: Three times a day (TID) | ORAL | 0 refills | Status: DC | PRN

## 2018-05-20 NOTE — Patient Instructions (Addendum)
Thank you for coming to the office today.  1. It sounds like you have a Sinusitis (Bacterial Infection) - this most likely started as an Upper Respiratory Virus that has settled into an infection. Allergies can also cause this. - Start Azithromycin Z pak (antibiotic) 2 tabs day 1, then 1 tab x 4 days, complete entire course even if improved - Start nasal steroid Flonase 2 sprays in each nostril daily for 4-6 weeks, may repeat course seasonally or as needed - Start [employer] Perls take 1 capsule up to 3 times a day as needed for cough  Continue other DayQuil / NyQuil  May try OTC Mucinex (plain only without DM cough or Sinus) up to 7-10 days then stop  - Improve hydration by drinking plenty of clear fluids (water, gatorade) to reduce secretions and thin congestion - Congestion draining down throat can cause irritation. May try warm herbal tea with honey, cough drops - Can take Tylenol or Ibuprofen as needed for fevers  If coughing worse or asthma short of breath wheezing, call office if mild and we can phone in prednisone, otherwise if severe may need additional help and evaluation here or at hospital ED  If you develop persistent fever >101F for at least 3 consecutive days, headaches with sinus pain or pressure or persistent earache, please schedule a follow-up evaluation within next few days to week.   Please schedule a Follow-up Appointment to: Return in about 1 week (around 05/27/2018), or if symptoms worsen or fail to improve, for sinusitis.  If you have any other questions or concerns, please feel free to call the office or send a message through MyChart. You may also schedule an earlier appointment if necessary.  Additionally, you may be receiving a survey about your experience at our office within a few days to 1 week by e-mail or mail. We value your feedback.  Saralyn PilarAlexander Elleah Hemsley, DO [facility]

## 2018-05-20 NOTE — Progress Notes (Signed)
Subjective:    Patient ID: Derrick Gutierrez, male    DOB: 07-Jun-1981, 37 y.o.   MRN: [id number]  Derrick Reichmannlliott Mounger is a 37 y.o. male presenting on 05/20/2018 for Cough (chest congestion onset 4 days)  Patient presents for a same day appointment. Patient has seen Dr Juanetta GoslingHawkins in past, I have only seen him for one other acute visit 12/2017. 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  HPI   SINUSITIS Reports symptoms started about 4 days ago with sinus congestion and led to productive cough with thicker white sputum / mucus. He admits still coughing. Tried OTC DayQuil / NyQuil with only mild relief. No other treatments tried. - he has history of asthma, but not using albuterol PRN now, seems it is mild. - No known sick contacts. No recent URI - Denies any sinus pain or pressure, nausea vomiting, abdominal pain, muscle aches or body aches, fevers or chills, wheezing dyspnea  Health Maintenance: Not interested in Flu Shot, declines.  [facility] 05/20/2018 12/04/2017 03/07/2016  Decreased Interest 0 0 3  Down, Depressed, Hopeless 0 1 1  PHQ - 2 Score 0 1 4  Altered sleeping 0 0 0  Tired, decreased energy 0 0 2  Change in appetite 0 1 0  Feeling bad or failure about yourself  0 0 2  Trouble concentrating 0 0 1  Moving slowly or fidgety/restless 0 0 0  Suicidal thoughts 0 0 0  PHQ-9 Score 0 2 9  Difficult doing work/chores Not difficult at all Not difficult at all Not difficult at all    Social History   Tobacco Use  . Smoking status: Never Smoker  . Smokeless tobacco: Never Used  Substance Use Topics  . Alcohol use: No    Alcohol/week: 0.0 standard drinks  . Drug use: No    Review of Systems Per HPI unless specifically indicated above     Objective:    BP 120/83   Pulse (!) 126   Temp 97.7 F (36.5 C) (Oral)   Resp 16   Ht 5\' 9"  (1.753 m)   Wt 284 lb (128.8 kg)   SpO2 99%   BMI 41.94 kg/m   Wt Readings from Last 3 Encounters:  05/20/18 284 lb (128.8 kg)    12/04/17 290 lb (131.5 kg)  09/22/16 253 lb (114.8 kg)    Physical Exam  Constitutional: He is oriented to person, place, and time. He appears well-developed and well-nourished. No distress.  Well-appearing, comfortable, cooperative, obese  HENT:  Head: Normocephalic and atraumatic.  Mouth/Throat: Oropharynx is clear and moist.  Maxillary sinuses mild-tender. Nares patent with some mild deeper turbinate edema and congestion without purulence. Bilateral TMs clear without erythema, effusion or bulging. Oropharynx clear without erythema, exudates, edema or asymmetry.  Eyes: Conjunctivae are normal. Right eye exhibits no discharge. Left eye exhibits no discharge.  Cardiovascular: Regular rhythm, normal heart sounds and intact distal pulses.  No murmur heard. Tachycardic  Pulmonary/Chest: Effort normal and breath sounds normal. No respiratory distress. He has no wheezes. He has no rales.  No wheezing or focal lung abnormality.  Musculoskeletal: Normal range of motion. He exhibits no edema.  Neurological: He is alert and oriented to person, place, and time.  Skin: Skin is warm and dry. No rash noted. He is not diaphoretic. No erythema.  Psychiatric: He has a normal mood and affect. His behavior is normal.  Well groomed, good eye contact, normal speech and thoughts  Nursing note and vitals reviewed.  Assessment & Plan:   Problem List Items Addressed This Visit    Asthma, mild intermittent    Other Visit Diagnoses    Acute non-recurrent maxillary sinusitis    -  Primary   Relevant Medications   fluticasone (FLONASE) 50 MCG/ACT nasal spray   azithromycin (ZITHROMAX Z-PAK) 250 MG tablet   benzonatate (TESSALON) 100 MG capsule   Seasonal allergic rhinitis due to other allergic trigger       Relevant Medications   fluticasone (FLONASE) 50 MCG/ACT nasal spray      Consistent with acute maxillary sinusitis, likely initially viral URI vs allergic rhinitis component Concern with  history of asthma, uncomplicated currently Worsening productive cough.  Plan: 1. Given current course, agree to provide Azithromycin Z pak (antibiotic) 2 tabs day 1, then 1 tab x 4 days, complete entire course even if improved - Start Tessalon Perls take 1 capsule up to 3 times a day as needed for cough - Start nasal steroid Flonase 2 sprays in each nostril daily for 4-6 weeks, may repeat course seasonally or as needed - Continue DayQuil / NyQuil - Start Mucinex plain without additive Return criteria reviewed   Meds ordered this encounter  Medications  . fluticasone (FLONASE) 50 MCG/ACT nasal spray    Sig: Place 2 sprays into both nostrils daily.    Dispense:  15.8 g    Refill:  3  . azithromycin (ZITHROMAX Z-PAK) 250 MG tablet    Sig: Take 2 tabs (500mg  total) on Day 1. Take 1 tab (250mg ) daily for next 4 days.    Dispense:  6 tablet    Refill:  0  . benzonatate (TESSALON) 100 MG capsule    Sig: Take 1 capsule (100 mg total) by mouth 3 (three) times daily as needed for cough.    Dispense:  30 capsule    Refill:  0    Follow up plan: Return in about 1 week (around 05/27/2018), or if symptoms worsen or fail to improve, for sinusitis.  Saralyn Pilar, DO [facility] 05/20/2018, 2:06 PM
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
## 2018-05-29 ENCOUNTER — Ambulatory Visit (INDEPENDENT_AMBULATORY_CARE_PROVIDER_SITE_OTHER): Payer: Medicare Other | Admitting: Licensed Clinical Social Worker

## 2018-05-29 DIAGNOSIS — F332 Major depressive disorder, recurrent severe without psychotic features: Secondary | ICD-10-CM | POA: Diagnosis not present

## 2018-06-17 ENCOUNTER — Ambulatory Visit: Payer: Medicare Other | Admitting: Psychiatry

## 2018-06-20 ENCOUNTER — Ambulatory Visit: Payer: Medicare Other | Admitting: Licensed Clinical Social Worker

## 2018-07-17 ENCOUNTER — Ambulatory Visit: Payer: Medicare Other | Admitting: Licensed Clinical Social Worker

## 2018-07-18 NOTE — Progress Notes (Signed)
   THERAPIST PROGRESS NOTE  Session Time:  Participation Level: Active  Behavioral Response: CasualAlertEuthymic  Type of Therapy: Individual Therapy  Treatment Goals addressed: Anxiety  Interventions: Solution Focused  Summary: Derrick Gutierrez is a 38 y.o. male who presents with continued symptoms of diagnosis.  Therapist initiated today's visit by probing and eliciting information from Patient about his problematic behaviors to determine his progress on the goal. Therapist educated Patient on his diagnosis and the symptoms that he displays. Therapist questioned Patient about his perspective on whether or not he's made progress on goals. Therapist facilitated a therapeutic activity with Patient to assist him with recognizing and identifying his negative thinking patterns and to assist with increasing his positive thinking skills. Therapist reviewed, explained and summarized with Patient all objectives learned and asked questions for clarification. Therapist reviewed and educated Patient on tools and coping skills to assist Patient with working towards achieving his treatment goals.   Suicidal/Homicidal: No  Plan: Return again in 2 weeks.  Diagnosis: Axis I: Depression    Axis II: No diagnosis    [name], LCSW 05/29/2018

## 2018-07-29 ENCOUNTER — Other Ambulatory Visit: Payer: Self-pay

## 2018-07-29 ENCOUNTER — Ambulatory Visit: Payer: Medicare Other | Admitting: Psychiatry

## 2018-07-29 ENCOUNTER — Encounter: Payer: Self-pay | Admitting: Psychiatry

## 2018-07-29 VITALS — BP 121/84 | HR 108 | Temp 98.2°F | Wt 278.6 lb

## 2018-07-29 DIAGNOSIS — F900 Attention-deficit hyperactivity disorder, predominantly inattentive type: Secondary | ICD-10-CM | POA: Diagnosis not present

## 2018-07-29 DIAGNOSIS — F332 Major depressive disorder, recurrent severe without psychotic features: Secondary | ICD-10-CM

## 2018-07-29 MED ORDER — ATOMOXETINE HCL 80 MG PO CAPS: 80.0000 mg | ORAL_CAPSULE | Freq: Every day | ORAL | 2 refills | Status: DC

## 2018-07-29 MED ORDER — BUSPIRONE HCL 10 MG PO TABS: 10.0000 mg | ORAL_TABLET | Freq: Every morning | ORAL | 1 refills | Status: DC

## 2018-07-29 MED ORDER — VENLAFAXINE HCL ER 150 MG PO CP24: 150.0000 mg | ORAL_CAPSULE | Freq: Every morning | ORAL | 1 refills | Status: DC
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
## 2018-08-13 ENCOUNTER — Ambulatory Visit (INDEPENDENT_AMBULATORY_CARE_PROVIDER_SITE_OTHER): Payer: Medicare Other | Admitting: Licensed Clinical Social Worker

## 2018-08-13 DIAGNOSIS — F332 Major depressive disorder, recurrent severe without psychotic features: Secondary | ICD-10-CM | POA: Diagnosis not present

## 2018-09-02 ENCOUNTER — Ambulatory Visit (INDEPENDENT_AMBULATORY_CARE_PROVIDER_SITE_OTHER): Payer: Medicare Other | Admitting: Licensed Clinical Social Worker

## 2018-09-02 DIAGNOSIS — F332 Major depressive disorder, recurrent severe without psychotic features: Secondary | ICD-10-CM

## 2018-09-26 ENCOUNTER — Other Ambulatory Visit: Payer: Self-pay

## 2018-09-26 ENCOUNTER — Ambulatory Visit (INDEPENDENT_AMBULATORY_CARE_PROVIDER_SITE_OTHER): Payer: Medicare Other | Admitting: Licensed Clinical Social Worker

## 2018-09-26 DIAGNOSIS — F332 Major depressive disorder, recurrent severe without psychotic features: Secondary | ICD-10-CM | POA: Diagnosis not present

## 2018-09-26 NOTE — Progress Notes (Signed)
   THERAPIST PROGRESS NOTE  Session Time: 30 min  Participation Level: Active  Behavioral Response: CasualAlertDepressed  Type of Therapy: Individual Therapy  Treatment Goals addressed: Coping  Interventions: Solution Focused  Summary: Derrick Gutierrez is a 38 y.o. male who presents with continued symptoms of his diagnosis. Patient reports minimal depressive symptoms.  Patient nervous about being able to meet financial monthly requirements.  Explored and provided DSS as a reference for food stamps and encouraged to apply for unemployment. Explored ways to cope throughout [place] 19 stay at home order.   Suicidal/Homicidal: No  Plan: Return again in 2 weeks.  Diagnosis: Axis I: Depression    Axis II: No diagnosis    [name], LCSW 09/26/2018

## 2018-10-21 ENCOUNTER — Other Ambulatory Visit: Payer: Self-pay

## 2018-10-21 ENCOUNTER — Ambulatory Visit (INDEPENDENT_AMBULATORY_CARE_PROVIDER_SITE_OTHER): Payer: Medicare Other | Admitting: Psychiatry

## 2018-10-21 ENCOUNTER — Encounter: Payer: Self-pay | Admitting: Psychiatry

## 2018-10-21 DIAGNOSIS — F332 Major depressive disorder, recurrent severe without psychotic features: Secondary | ICD-10-CM | POA: Diagnosis not present

## 2018-10-21 DIAGNOSIS — F900 Attention-deficit hyperactivity disorder, predominantly inattentive type: Secondary | ICD-10-CM

## 2018-10-21 MED ORDER — TRAZODONE HCL 100 MG PO TABS: 100.0000 mg | ORAL_TABLET | Freq: Every evening | ORAL | 1 refills | Status: DC | PRN

## 2018-10-21 MED ORDER — VENLAFAXINE HCL ER 150 MG PO CP24: 150.0000 mg | ORAL_CAPSULE | Freq: Every morning | ORAL | 1 refills | Status: DC

## 2018-10-21 MED ORDER — ATOMOXETINE HCL 80 MG PO CAPS: 80.0000 mg | ORAL_CAPSULE | Freq: Every day | ORAL | 2 refills | Status: DC

## 2018-10-21 MED ORDER — BUSPIRONE HCL 10 MG PO TABS: 10.0000 mg | ORAL_TABLET | Freq: Every morning | ORAL | 1 refills | Status: DC

## 2018-10-21 NOTE — Progress Notes (Signed)
Patient ID: Derrick Gutierrez, male   DOB: 12-Feb-1981, 38 y.o.   MRN: [id number]  Patient is a 38 year old male with history of depression and ADHD who was called for follow-up. He reported that he's doing well and staying at home. 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 He stated that he has been compliant with his medications. No other acute issues noted. He denied feeling depressed hopeless helpless. He is concerned about the  COVID situation but is following with the recommendations.  Plan. I will refill his medications. I discussed the assessment and treatment plan with the patient. The patient was provided an opportunity to ask questions and all were answered. The patient agreed with the plan and demonstrated an understanding of the instructions.   The patient was advised to call back or seek an in-person evaluation if the symptoms worsen or if the condition fails to improve as anticipated.   I provided 10 minutes of non-face-to-face time during this encounter.

## 2018-10-21 NOTE — Progress Notes (Cosign Needed)
TC on  10-21-18@ 8:16 pt medical and surgical hx was reviewed with no changes. Pt allergies reviewed with no changes. Pt medications and pharmacy was reviewed and updated. No vitals taken because this is a phone visit.

## 2018-11-10 NOTE — Progress Notes (Signed)
   THERAPIST PROGRESS NOTE  Session Time: 1 hour  Participation Level: Active  Behavioral Response: DisheveledAlertAnxious  Type of Therapy: Individual Therapy  Treatment Goals addressed: Anxiety  Interventions: CBT and Motivational Interviewing  Summary: Derrick Gutierrez is a 38 y.o. male who presents with continued symptoms of his diagnosis.  Discussed current stressors. Patient reports that his wife is a trigger and his inability to support his mother and sister are triggers.  Explored ways to improve communication strategies with his wife.  Role played skills.   Suicidal/Homicidal: No  Plan: Return again in 2 weeks.  Diagnosis: Axis I: Depressive Disorder NOS    Axis II: No diagnosis    [name], LCSW 08/13/2018

## 2018-11-11 NOTE — Progress Notes (Signed)
   THERAPIST PROGRESS NOTE  Session Time: 1 hour  Participation Level: Active  Behavioral Response: DisheveledAlertAnxious  Type of Therapy: Individual Therapy  Treatment Goals addressed: Anxiety  Interventions: CBT and Motivational Interviewing  Summary: Derrick Gutierrez is a 38 y.o. male who presents with continued symptoms of his diagnosis.  Patient reports mood as "ok."  Patient reports stressors as marriage and inability to assist mother with financial needs.  Factors that contribute to client's ongoing depressive symptoms were discussed and include real and perceived feelings of isolation, criticism, rejection, shame and guilt.   Suicidal/Homicidal: No  Plan: Return again in 2 weeks.  Diagnosis: Axis I: Depressive Disorder NOS    Axis II: No diagnosis    [name], LCSW 09/02/2018

## 2018-11-13 ENCOUNTER — Other Ambulatory Visit: Payer: Self-pay

## 2018-11-13 ENCOUNTER — Ambulatory Visit (INDEPENDENT_AMBULATORY_CARE_PROVIDER_SITE_OTHER): Payer: Medicare Other | Admitting: Licensed Clinical Social Worker

## 2018-11-13 DIAGNOSIS — F332 Major depressive disorder, recurrent severe without psychotic features: Secondary | ICD-10-CM

## 2018-11-28 NOTE — Progress Notes (Signed)
Virtual Visit via Video Note  I connected with [name] on 11/13/18 at  3:00 PM EDT by a video enabled telemedicine application and verified that I am speaking with the correct person using two identifiers.  Location: Patient: home Provider: office   I discussed the limitations of evaluation and management by telemedicine and the availability of in person appointments. The patient expressed understanding and agreed to proceed.     Participation Level: Active  Type of Therapy: Individual Therapy  Treatment Goals addressed: Coping  Interventions: CBT and Motivational Interviewing  Summary: Derrick Gutierrez is a 38 y.o. male who presents with symptoms of diagnosis.   Therapist attempted to normalize his feelings about his current relationship and struggles. Therapist discussed with Patient how to manage his symptoms.  Therapist explained what managing symptoms mean and how to prevent relapse.  Discussion of coping with stress, building social support, develop a relapse prevention plan and the importance of medication compliance. Reviewed in detail each step.  Assisted Patient with comprehension by providing examples of each step and allowing Patient to make a plan to successful utilize each step.  Suicidal/Homicidal: No  Plan: Return again in 2 weeks.  Diagnosis: Axis I: Depression    Axis II: No diagnosis    I discussed the assessment and treatment plan with the patient. The patient was provided an opportunity to ask questions and all were answered. The patient agreed with the plan and demonstrated an understanding of the instructions.   The patient was advised to call back or seek an in-person evaluation if the symptoms worsen or if the condition fails to improve as anticipated.  I provided 60 minutes of non-face-to-face time during this encounter.   [name], LCSW

## 2018-12-02 ENCOUNTER — Other Ambulatory Visit: Payer: Self-pay

## 2018-12-02 ENCOUNTER — Ambulatory Visit: Payer: Medicare Other | Admitting: Licensed Clinical Social Worker

## 2018-12-02 DIAGNOSIS — F332 Major depressive disorder, recurrent severe without psychotic features: Secondary | ICD-10-CM

## 2018-12-06 ENCOUNTER — Other Ambulatory Visit: Payer: Self-pay

## 2018-12-06 ENCOUNTER — Encounter: Payer: Medicare Other | Admitting: Psychiatry

## 2018-12-06 NOTE — Progress Notes (Signed)
Patient reports he was supposed to see Dr.Faheem. Will ask front desk to reschedule.

## 2018-12-25 ENCOUNTER — Ambulatory Visit: Payer: Medicare Other | Admitting: Licensed Clinical Social Worker

## 2019-01-06 ENCOUNTER — Other Ambulatory Visit: Payer: Self-pay

## 2019-01-06 ENCOUNTER — Ambulatory Visit (INDEPENDENT_AMBULATORY_CARE_PROVIDER_SITE_OTHER): Payer: Medicare Other | Admitting: Psychiatry

## 2019-01-06 ENCOUNTER — Encounter: Payer: Self-pay | Admitting: Psychiatry

## 2019-01-06 DIAGNOSIS — F332 Major depressive disorder, recurrent severe without psychotic features: Secondary | ICD-10-CM | POA: Diagnosis not present

## 2019-01-06 DIAGNOSIS — F900 Attention-deficit hyperactivity disorder, predominantly inattentive type: Secondary | ICD-10-CM | POA: Diagnosis not present

## 2019-01-06 MED ORDER — VENLAFAXINE HCL ER 150 MG PO CP24: 150.0000 mg | ORAL_CAPSULE | Freq: Every morning | ORAL | 1 refills | Status: DC

## 2019-01-06 MED ORDER — BUSPIRONE HCL 10 MG PO TABS: 10.0000 mg | ORAL_TABLET | Freq: Every morning | ORAL | 1 refills | Status: DC

## 2019-01-06 MED ORDER — TRAZODONE HCL 150 MG PO TABS: 150.0000 mg | ORAL_TABLET | Freq: Every evening | ORAL | 1 refills | Status: DC | PRN

## 2019-01-06 MED ORDER — ATOMOXETINE HCL 80 MG PO CAPS: 80.0000 mg | ORAL_CAPSULE | Freq: Every day | ORAL | 2 refills | Status: DC

## 2019-01-06 NOTE — Progress Notes (Signed)
Patient ID: Derrick Gutierrez, male   DOB: 03/15/1981, 38 y.o.   MRN: [id number]   Patient is a 38 year old male with history of depression and ADD who was seen for follow-up. He reported that he has been having issues with asleep and the trazodone is not helpful. He is currently working [employer]. He stated that he lives with his wife and his family and trying to stay safe and has been using his mask. He denied having any other issues. He stated that he wants to go higher on the dose of trazodone. He does not use any drugs or alcohol.he appeared calm and alert over the phone. He wants to change the pharmacy at this time.  Plan I will refill his medications Increase trazodone 150mg   at bedtime He will follow been to one earlier depending on his symptoms.  I discussed the assessment and treatment plan with the patient. The patient was provided an opportunity to ask questions and all were answered. The patient agreed with the plan and demonstrated an understanding of the instructions.   The patient was advised to call back or seek an in-person evaluation if the symptoms worsen or if the condition fails to improve as anticipated.   I provided 15 minutes of non-face-to-face time during this encounter.

## 2019-01-15 ENCOUNTER — Other Ambulatory Visit: Payer: Self-pay | Admitting: Psychiatry

## 2019-01-20 ENCOUNTER — Other Ambulatory Visit: Payer: Self-pay

## 2019-01-20 ENCOUNTER — Ambulatory Visit: Payer: Medicare Other | Admitting: Licensed Clinical Social Worker

## 2019-02-17 ENCOUNTER — Other Ambulatory Visit: Payer: Self-pay | Admitting: Psychiatry

## 2019-02-17 NOTE — Progress Notes (Signed)
   THERAPIST PROGRESS NOTE  Session Time: 57min  Participation Level: Active  Behavioral Response: CasualAlertDepressed  Type of Therapy: Individual Therapy  Treatment Goals addressed: Communication: with wife and Coping  Interventions: CBT and Motivational Interviewing  Summary: Derrick Gutierrez is a 38 y.o. male who presents with his wife.  Each person introduced self and discussed their goal for the day.  Discussed communication.  Role played communication.  Reminded each person to compromise.  Utilized therapistaid.com to assist with assist with communication intervention   Suicidal/Homicidal: No  Plan: Return again in 2 weeks.  Diagnosis: Axis I: Depression    Axis II: No diagnosis    Lubertha South, LCSW 12/02/2018

## 2019-03-24 ENCOUNTER — Ambulatory Visit: Payer: Medicare Other | Admitting: Psychiatry

## 2019-03-24 ENCOUNTER — Other Ambulatory Visit: Payer: Self-pay

## 2019-04-28 ENCOUNTER — Other Ambulatory Visit: Payer: Self-pay

## 2019-04-28 ENCOUNTER — Ambulatory Visit (INDEPENDENT_AMBULATORY_CARE_PROVIDER_SITE_OTHER): Payer: Medicare Other | Admitting: Family Medicine

## 2019-04-28 ENCOUNTER — Encounter: Payer: Self-pay | Admitting: Family Medicine

## 2019-04-28 VITALS — BP 126/73 | HR 88 | Ht 69.0 in | Wt 298.4 lb

## 2019-04-28 DIAGNOSIS — Z6841 Body Mass Index (BMI) 40.0 and over, adult: Secondary | ICD-10-CM

## 2019-04-28 DIAGNOSIS — J4520 Mild intermittent asthma, uncomplicated: Secondary | ICD-10-CM

## 2019-04-28 DIAGNOSIS — Z23 Encounter for immunization: Secondary | ICD-10-CM | POA: Diagnosis not present

## 2019-04-28 DIAGNOSIS — R7309 Other abnormal glucose: Secondary | ICD-10-CM

## 2019-04-28 DIAGNOSIS — Z79899 Other long term (current) drug therapy: Secondary | ICD-10-CM

## 2019-04-28 DIAGNOSIS — M25552 Pain in left hip: Secondary | ICD-10-CM

## 2019-04-28 DIAGNOSIS — F900 Attention-deficit hyperactivity disorder, predominantly inattentive type: Secondary | ICD-10-CM

## 2019-04-28 DIAGNOSIS — E7800 Pure hypercholesterolemia, unspecified: Secondary | ICD-10-CM

## 2019-04-28 DIAGNOSIS — Z0000 Encounter for general adult medical examination without abnormal findings: Secondary | ICD-10-CM | POA: Diagnosis not present

## 2019-04-28 DIAGNOSIS — G8929 Other chronic pain: Secondary | ICD-10-CM | POA: Diagnosis not present

## 2019-04-28 DIAGNOSIS — F3341 Major depressive disorder, recurrent, in partial remission: Secondary | ICD-10-CM

## 2019-04-28 NOTE — Patient Instructions (Addendum)
Thank you for coming to the office today.  Referral to [place] stay tuned for apt and further instruction  [facility] [place], [place]  [address] Phone: [phone]   DUE for FASTING BLOOD WORK (no food or drink after midnight before the lab appointment, only water or coffee without cream/sugar on the morning of)  SCHEDULE "Lab Only" visit in the morning at the clinic for lab draw in 1 WEEK  For Lab Results, once available within 2-3 days of blood draw, you can can log in to MyChart online to view your results and a brief explanation. Also, we can discuss results at next follow-up visit.   Please schedule a Follow-up Appointment to: Return in about 1 year (around 04/27/2020) for Annual Physical.  If you have any other questions or concerns, please feel free to call the office or send a message through [place]. You may also schedule an earlier appointment if necessary.  Additionally, you may be receiving a survey about your experience at our office within a few days to 1 week by e-mail or mail. We value your feedback.  Nobie Putnam, DO [place]
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
## 2019-05-02 ENCOUNTER — Other Ambulatory Visit: Payer: Medicare Other

## 2019-05-05 ENCOUNTER — Telehealth: Payer: Self-pay

## 2019-05-05 NOTE — Telephone Encounter (Signed)
Patient was not interested to return to previous surgeon Dr Marcelino Scot.  He was referred in past to [facility] and they would not see him either.  I am not sure how else to proceed. Patient is requesting other specialist locally instead of going back to [facility] to Dr Marcelino Scot.  Nobie Putnam, [facility] 05/05/2019, 1:42 PM

## 2019-05-05 NOTE — Telephone Encounter (Signed)
[facility] called and they received the referral for Mr. Derrick Gutierrez and both providers that work on hips at [facility] report that this patient would be good with Dr. Altamese [place] with  [facility] [address] [place], [place] [address] [phone]

## 2019-05-06 ENCOUNTER — Encounter: Payer: Self-pay | Admitting: Psychiatry

## 2019-05-06 ENCOUNTER — Ambulatory Visit (INDEPENDENT_AMBULATORY_CARE_PROVIDER_SITE_OTHER): Payer: Medicare Other | Admitting: Psychiatry

## 2019-05-06 ENCOUNTER — Other Ambulatory Visit: Payer: Self-pay

## 2019-05-06 DIAGNOSIS — F418 Other specified anxiety disorders: Secondary | ICD-10-CM

## 2019-05-06 DIAGNOSIS — F900 Attention-deficit hyperactivity disorder, predominantly inattentive type: Secondary | ICD-10-CM

## 2019-05-06 DIAGNOSIS — F332 Major depressive disorder, recurrent severe without psychotic features: Secondary | ICD-10-CM | POA: Insufficient documentation

## 2019-05-06 MED ORDER — TRAZODONE HCL 150 MG PO TABS: 150.0000 mg | ORAL_TABLET | Freq: Every evening | ORAL | 0 refills | Status: DC | PRN

## 2019-05-06 MED ORDER — ATOMOXETINE HCL 80 MG PO CAPS: 80.0000 mg | ORAL_CAPSULE | Freq: Every day | ORAL | 0 refills | Status: DC

## 2019-05-06 MED ORDER — BUSPIRONE HCL 10 MG PO TABS: 10.0000 mg | ORAL_TABLET | Freq: Two times a day (BID) | ORAL | 0 refills | Status: DC

## 2019-05-06 NOTE — Telephone Encounter (Signed)
Patient was advised seeing by Dr. Marcelino Scot since [facility] suggested his name, he will call back with his decision.
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
## 2019-05-09 ENCOUNTER — Ambulatory Visit: Payer: Medicare Other | Admitting: Family Medicine

## 2019-07-02 ENCOUNTER — Ambulatory Visit (INDEPENDENT_AMBULATORY_CARE_PROVIDER_SITE_OTHER): Payer: Medicare Other | Admitting: Family Medicine

## 2019-07-02 ENCOUNTER — Telehealth: Payer: Medicare Other

## 2019-07-02 ENCOUNTER — Encounter: Payer: Self-pay | Admitting: Family Medicine

## 2019-07-02 ENCOUNTER — Other Ambulatory Visit: Payer: Self-pay

## 2019-07-02 DIAGNOSIS — R519 Headache, unspecified: Secondary | ICD-10-CM | POA: Diagnosis not present

## 2019-07-02 NOTE — Patient Instructions (Addendum)
Start Ibuprofen OTC 600mg  (x 3 of the 200mg  pills) max dose can be up to 4 pills for 800mg  - take WITH FOOD every 6-8 hours or 3 times a day for now, for few days to help break the cycle of headache, try not to take every day going forward to limit rebound headaches.  May also try OTC Excedrin Migraine to see if this helps, it has caffeine.  Tylenol Extra Strength is safe to mix in as well - between doses- 500mg  take 1 to 2 pills as needed up to 3 times a day max.  Stay well hydrated  Avoid major sources of caffeine and other causes of headache.  If no new symptoms or concerns, goal is to break the headache cycle within 1 week, if still bothering you or new concerns or new symptoms then contact us or seek care sooner.  If severe headache, loss of vision, numbness tingling, weakness, chest pain, short of breath, stroke symptoms - then call 911 or seek care at hospital immediately  Please schedule a Follow-up Appointment to: Return in about 2 weeks (around 07/16/2019), or if symptoms worsen or fail to improve, for headache.  If you have any other questions or concerns, please feel free to call the office or send a message through [place]. You may also schedule an earlier appointment if necessary.  Additionally, you may be receiving a survey about your experience at our office within a few days to 1 week by e-mail or mail. We value your feedback.  Nobie Putnam, DO [place]

## 2019-07-02 NOTE — Progress Notes (Signed)
Virtual Visit via Telephone The purpose of this virtual visit is to provide medical care while limiting exposure to the novel coronavirus (COVID19) for both patient and office staff.  Consent was obtained for phone visit:  Yes.   Answered questions that patient had about telehealth interaction:  Yes.   I discussed the limitations, risks, security and privacy concerns of performing an evaluation and management service by telephone. I also discussed with the patient that there may be a patient responsible charge related to this service. The patient expressed understanding and agreed to proceed.  Patient Location: Home Provider Location: [facility]  ---------------------------------------------------------------------- Chief Complaint  Patient presents with  . Headache    onset 2 weeks intermitten but pain level is 9    S: Reviewed CMA documentation. I have called patient and gathered additional HPI as follows:  Posterior Headache, intermittent Reports that symptoms started 2 weeks ago, localized to back of head, with severe pain up to 9 out of 10 by his report, seems intermittent and episodic, lasting up to 1 hour at a time if not taking medicine, but can take Tylenol PRN to help it ease within 30 minutes does not resolve, usually will get about 3 headaches a day, he has some headache free days in past 2 weeks. He believes stress can make it worse. Not worse with physical exertion. He can take Ibuprofen but hasn't started it. - he does not  - Admits some occasional dizziness, lightheadedness with headache Denies nausea, vomiting, numbness tingling weakness, syncope or near syncope  Denies any high risk travel to areas of current concern for COVID19. Denies any known or suspected exposure to person with or possibly with COVID19.  Denies any fevers, chills, sweats, body ache, cough, shortness of breath, sinus pain or pressure, headache, abdominal pain,  diarrhea  Past Medical History:  Diagnosis Date  . ADHD (attention deficit hyperactivity disorder)   . Anxiety   . Asthma   . Seizures (HCC)    Social History   Tobacco Use  . Smoking status: Never Smoker  . Smokeless tobacco: Never Used  Substance Use Topics  . Alcohol use: No    Alcohol/week: 0.0 standard drinks  . Drug use: No    Current Outpatient Medications:  .  atomoxetine (STRATTERA) 80 MG capsule, Take 1 capsule (80 mg total) by mouth daily., Disp: 90 capsule, Rfl: 0 .  busPIRone (BUSPAR) 10 MG tablet, Take 1 tablet (10 mg total) by mouth 2 (two) times daily., Disp: 180 tablet, Rfl: 0 .  fluticasone (FLONASE) 50 MCG/ACT nasal spray, Place 2 sprays into both nostrils daily., Disp: 15.8 g, Rfl: 3 .  PROAIR HFA 108 (90 Base) MCG/ACT inhaler, INHALE 2 PUFFS INTO THE LUNGS EVERY 6 HOURS AS NEEDED FOR WHEEZING OR SHORTNESS OF BREATH, Disp: 8.5 g, Rfl: 0 .  traZODone (DESYREL) 150 MG tablet, Take 1 tablet (150 mg total) by mouth at bedtime as needed for sleep., Disp: 90 tablet, Rfl: 0  Depression screen [facility] 2/9 07/02/2019 04/28/2019 05/20/2018  Decreased Interest 0 0 0  Down, Depressed, Hopeless 0 0 0  PHQ - 2 Score 0 0 0  Altered sleeping - 1 0  Tired, decreased energy - 3 0  Change in appetite - 1 0  Feeling bad or failure about yourself  - 0 0  Trouble concentrating - 1 0  Moving slowly or fidgety/restless - 0 0  Suicidal thoughts - 0 0  PHQ-9 Score - 6 0  Difficult  doing work/chores - Somewhat difficult Not difficult at all    GAD 7 : Generalized Anxiety Score 04/28/2019  Nervous, Anxious, on Edge 0  Control/stop worrying 0  Worry too much - different things 0  Trouble relaxing 0  Restless 0  Easily annoyed or irritable 0  Afraid - awful might happen 0  Total GAD 7 Score 0  Anxiety Difficulty Not difficult at all    -------------------------------------------------------------------------- O: No physical exam performed due to remote telephone  encounter.  Lab results reviewed.  No results found for this or any previous visit (from the past 2160 hour(s)).  -------------------------------------------------------------------------- A&P:  Problem List Items Addressed This Visit    None    Visit Diagnoses    Intermittent headache    -  Primary     Clinically consistent with posterior intermittent episodic moderate severity, uncertain trigger Not consistent with migraine headache Improved on Tylenol PRN No other known triggers. New problem recently No focal neuro deficits or other concerns Doesn't seem related to his blood pressure, has monitor - advised to check regularly now. Parameters given.  Start by increase to NSAID oral Ibuprofen OTC 400-800 q 6-8 hr PRN - prefer start 600mg  Use Tylenol PRN Consider excedrin migraine Caution rebound headache  Reviewed return criteria and other factors.  No orders of the defined types were placed in this encounter.   Follow-up: - Return in 1-2 week as needed PRN headache  Patient verbalizes understanding with the above medical recommendations including the limitation of remote medical advice.  Specific follow-up and call-back criteria were given for patient to follow-up or seek medical care more urgently if needed.   - Time spent in direct consultation with patient on phone: 9 minutes  Nobie Putnam, [facility] 07/02/2019, 3:23 PM

## 2019-07-09 ENCOUNTER — Telehealth: Payer: Self-pay | Admitting: Family Medicine

## 2019-07-09 DIAGNOSIS — R519 Headache, unspecified: Secondary | ICD-10-CM

## 2019-07-09 NOTE — Telephone Encounter (Signed)
Please notify patient that I have placed referral to [facility]  [facility] - Neurology Dept [address] [place], [place] [address] Phone: [phone]  They should call with apt. If not heard in 1-2 week he can call sooner to schedule w/ them if need.  If severe worsening or new changes, he may need to go directly to Urgent Care or Hospital ED if unbearable headache or other severe symptoms.  Saralyn Pilar, DO [facility] 07/09/2019, 4:50 PM

## 2019-07-09 NOTE — Telephone Encounter (Signed)
Pt said that he was still having headache wanted to  know if you could do a referral to a neurology

## 2019-07-09 NOTE — Telephone Encounter (Signed)
Patient informed. 

## 2019-07-29 ENCOUNTER — Ambulatory Visit: Payer: Medicare Other | Admitting: Psychiatry

## 2019-07-29 ENCOUNTER — Other Ambulatory Visit: Payer: Self-pay

## 2019-07-29 DIAGNOSIS — F418 Other specified anxiety disorders: Secondary | ICD-10-CM | POA: Insufficient documentation

## 2019-08-17 DIAGNOSIS — G479 Sleep disorder, unspecified: Secondary | ICD-10-CM | POA: Insufficient documentation

## 2019-08-17 DIAGNOSIS — R42 Dizziness and giddiness: Secondary | ICD-10-CM | POA: Insufficient documentation

## 2019-08-17 DIAGNOSIS — R519 Headache, unspecified: Secondary | ICD-10-CM | POA: Insufficient documentation

## 2019-08-18 ENCOUNTER — Ambulatory Visit: Payer: Medicare Other | Admitting: Family Medicine

## 2019-08-22 ENCOUNTER — Other Ambulatory Visit: Payer: Self-pay

## 2019-08-22 ENCOUNTER — Ambulatory Visit: Payer: Medicare Other | Admitting: Family Medicine

## 2019-09-10 ENCOUNTER — Encounter: Payer: Self-pay | Admitting: Family Medicine

## 2019-09-10 ENCOUNTER — Ambulatory Visit (INDEPENDENT_AMBULATORY_CARE_PROVIDER_SITE_OTHER): Payer: Medicare Other | Admitting: Family Medicine

## 2019-09-10 ENCOUNTER — Other Ambulatory Visit: Payer: Self-pay

## 2019-09-10 DIAGNOSIS — G4726 Circadian rhythm sleep disorder, shift work type: Secondary | ICD-10-CM | POA: Diagnosis not present

## 2019-09-10 DIAGNOSIS — R42 Dizziness and giddiness: Secondary | ICD-10-CM | POA: Diagnosis not present

## 2019-09-10 DIAGNOSIS — F3341 Major depressive disorder, recurrent, in partial remission: Secondary | ICD-10-CM

## 2019-09-10 MED ORDER — MECLIZINE HCL 25 MG PO TABS: 25.0000 mg | ORAL_TABLET | Freq: Three times a day (TID) | ORAL | 1 refills | Status: DC | PRN

## 2019-09-10 MED ORDER — TRAZODONE HCL 100 MG PO TABS: 50.0000 mg | ORAL_TABLET | Freq: Every evening | ORAL | 2 refills | Status: DC | PRN

## 2019-09-10 NOTE — Patient Instructions (Addendum)
Will add back Trazodone now 100mg  - start with half pill for 50mg  before bed (shift work sleep) and after 1 week can increase to 100mg  in future.  Take Meclizine for dizziness as needed  Call Dr follow-up with him if not improving after another 1-2 weeks after try Trazodone  Limit caffeine intake  Use Tylenol as needed. Caution overuse medication. Rebound headache.  If severe symptoms can go to hospital ED for more acute evaluation.  Please schedule a Follow-up Appointment to: Return in about 2 weeks (around 09/24/2019), or if symptoms worsen or fail to improve, for headache, dizziness.  If you have any other questions or concerns, please feel free to call the office or send a message through MyChart. You may also schedule an earlier appointment if necessary.  Additionally, you may be receiving a survey about your experience at our office within a few days to 1 week by e-mail or mail. We value your feedback.  , DO [facility], 09/26/2019

## 2019-09-10 NOTE — Progress Notes (Signed)
Virtual Visit via Telephone The purpose of this virtual visit is to provide medical care while limiting exposure to the novel coronavirus (COVID19) for both patient and office staff.  Consent was obtained for phone visit:  Yes.   Answered questions that patient had about telehealth interaction:  Yes.   I discussed the limitations, risks, security and privacy concerns of performing an evaluation and management service by telephone. I also discussed with the patient that there may be a patient responsible charge related to this service. The patient expressed understanding and agreed to proceed.  Patient Location: Home Provider Location: [facility])  ---------------------------------------------------------------------- Chief Complaint  Patient presents with  . Dizziness    pt complains of intermittent dizzy spells prior to headaches x 2 mths    S: Reviewed CMA documentation. I have called patient and gathered additional HPI as follows:  Headaches / Dizziness - episodic / Shift Work Sleep Disorder - Last visit with me 07/02/19, for virtual initial visit for same problem HA / Dizziness, treated with referral to [facility] Neurology, see prior notes for background information. - Interval update with saw Dr Malvin Johns 08/13/19 for initial consultation for this problem. He was started on Nortriptyline. - Today patient reports headaches are not improving on medicine. He called on 09/04/19 to neurology they increased nortriptyline dosing.  He admits lack of sleep is a problem he thinks affecting his current symptoms.  He works Thurs/Fri, 10pm to [employer], and Sat/Sun 6pm to 6am He does not get much sleep, approx 5 hours sleep. He drinks caffeine large amount in past  Now headaches and dizziness seems to happen more often, usually during day or evening when he is awake.  He previously has history of major depression, ADHD, anxiety and was on variety of meds in past including Trazodone 150mg   nightly for sleep back in 05/2019. He came off meds and has been on a new shift work schedule since November 2020 that seems to have triggered his current timeline of symptoms by his report.  Denies any high risk travel to areas of current concern for COVID19. Denies any known or suspected exposure to person with or possibly with COVID19.  Denies any fevers, chills, sweats, body ache, cough, shortness of breath, sinus pain or pressure, abdominal pain, diarrhea  Past Medical History:  Diagnosis Date  . ADHD (attention deficit hyperactivity disorder)   . Anxiety   . Asthma   . Seizures (HCC)    Social History   Tobacco Use  . Smoking status: Never Smoker  . Smokeless tobacco: Never Used  Substance Use Topics  . Alcohol use: No    Alcohol/week: 0.0 standard drinks  . Drug use: No    Current Outpatient Medications:  .  nortriptyline (PAMELOR) 10 MG capsule, Take nortriptyline 10 mg at night for three nights then increase to 20 mg at night and continue.  If needed can increase to 30 mg at night after three nights on 20 mg if headaches are not improving, Disp: , Rfl:  .  PROAIR HFA 108 (90 Base) MCG/ACT inhaler, INHALE 2 PUFFS INTO THE LUNGS EVERY 6 HOURS AS NEEDED FOR WHEEZING OR SHORTNESS OF BREATH, Disp: 8.5 g, Rfl: 0 .  fluticasone (FLONASE) 50 MCG/ACT nasal spray, Place 2 sprays into both nostrils daily. (Patient not taking: Reported on 09/10/2019), Disp: 15.8 g, Rfl: 3 .  meclizine (ANTIVERT) 25 MG tablet, Take 1 tablet (25 mg total) by mouth 3 (three) times daily as needed for dizziness., Disp:  30 tablet, Rfl: 1 .  traZODone (DESYREL) 100 MG tablet, Take 0.5-1 tablets (50-100 mg total) by mouth at bedtime as needed for sleep. Start with half tablet for dose 50mg  before bed then after first 1 week or less, can increase to 1 whole tab 100mg  before bed, Disp: 30 tablet, Rfl: 2  Depression screen [facility] 2/9 09/10/2019 07/02/2019 04/28/2019  Decreased Interest 0 0 0  Down, Depressed,  Hopeless 1 0 0  PHQ - 2 Score 1 0 0  Altered sleeping 1 - 1  Tired, decreased energy 0 - 3  Change in appetite 0 - 1  Feeling bad or failure about yourself  0 - 0  Trouble concentrating 0 - 1  Moving slowly or fidgety/restless 0 - 0  Suicidal thoughts 0 - 0  PHQ-9 Score 2 - 6  Difficult doing work/chores Not difficult at all - Somewhat difficult    GAD 7 : Generalized Anxiety Score 04/28/2019  Nervous, Anxious, on Edge 0  Control/stop worrying 0  Worry too much - different things 0  Trouble relaxing 0  Restless 0  Easily annoyed or irritable 0  Afraid - awful might happen 0  Total GAD 7 Score 0  Anxiety Difficulty Not difficult at all    -------------------------------------------------------------------------- O: No physical exam performed due to remote telephone encounter.  Lab results reviewed.  No results found for this or any previous visit (from the past 2160 hour(s)).  -------------------------------------------------------------------------- A&P:  Problem List Items Addressed This Visit    Depression, major, recurrent, in partial remission ([place])   Relevant Medications   nortriptyline (PAMELOR) 10 MG capsule   traZODone (DESYREL) 100 MG tablet    Other Visit Diagnoses    Shift work sleep disorder    -  Primary   Relevant Medications   traZODone (DESYREL) 100 MG tablet   Episode of dizziness       Relevant Medications   meclizine (ANTIVERT) 25 MG tablet     Persistent intermittent headaches / dizziness with poor sleep < 5 hours, high caffeine consumption At risk of headaches secondary to poor sleep and rebound headache, on shift work - seems to be correct timeline with the onset of his symptoms since 05/2019 to 06/2019  Followed by [facility] Neuro Dr Melrose Nakayama on Nortriptyline  Will add back Trazodone now 100mg  - start with half pill for 50mg  before bed (shift work sleep) and after 1 week can increase to 100mg  in future.  Continue Nortriptyline  Add Meclizine  PRN dizziness.  Follow-up with Neurology after 1 week if not improving still. He can discuss with them other med options if Nortriptyline is unsuccessful to manage his headaches. If we need to consider psych consult we can reconsider.  Meds ordered this encounter  Medications  . traZODone (DESYREL) 100 MG tablet    Sig: Take 0.5-1 tablets (50-100 mg total) by mouth at bedtime as needed for sleep. Start with half tablet for dose 50mg  before bed then after first 1 week or less, can increase to 1 whole tab 100mg  before bed    Dispense:  30 tablet    Refill:  2  . meclizine (ANTIVERT) 25 MG tablet    Sig: Take 1 tablet (25 mg total) by mouth 3 (three) times daily as needed for dizziness.    Dispense:  30 tablet    Refill:  1    Follow-up: - Return in 2-4 weeks  Patient verbalizes understanding with the above medical recommendations including the limitation of remote  medical advice.  Specific follow-up and call-back criteria were given for patient to follow-up or seek medical care more urgently if needed.   - Time spent in direct consultation with patient on phone: 9 minutes  Saralyn Pilar, DO [facility] 09/10/2019, 1:45 PM

## 2019-10-30 ENCOUNTER — Other Ambulatory Visit: Payer: Medicare Other

## 2019-10-30 ENCOUNTER — Other Ambulatory Visit: Payer: Self-pay

## 2019-10-30 NOTE — Progress Notes (Signed)
Presents for pre-employment drug screen. Specimen collected using [employer] Chain of Custody form for [facility] account number [id number]. Specimen ID [id number]  AMD

## 2019-11-20 ENCOUNTER — Ambulatory Visit (INDEPENDENT_AMBULATORY_CARE_PROVIDER_SITE_OTHER): Payer: Medicare Other | Admitting: Family Medicine

## 2019-11-20 ENCOUNTER — Other Ambulatory Visit: Payer: Self-pay

## 2019-11-20 ENCOUNTER — Encounter: Payer: Self-pay | Admitting: Family Medicine

## 2019-11-20 VITALS — BP 119/73 | HR 93 | Temp 97.5°F | Ht 69.0 in | Wt 326.4 lb

## 2019-11-20 DIAGNOSIS — M25552 Pain in left hip: Secondary | ICD-10-CM

## 2019-11-20 DIAGNOSIS — Z8781 Personal history of (healed) traumatic fracture: Secondary | ICD-10-CM | POA: Diagnosis not present

## 2019-11-20 DIAGNOSIS — M1652 Unilateral post-traumatic osteoarthritis, left hip: Secondary | ICD-10-CM | POA: Diagnosis not present

## 2019-11-20 DIAGNOSIS — M25551 Pain in right hip: Secondary | ICD-10-CM

## 2019-11-20 DIAGNOSIS — G8929 Other chronic pain: Secondary | ICD-10-CM

## 2019-11-20 DIAGNOSIS — Z6841 Body Mass Index (BMI) 40.0 and over, adult: Secondary | ICD-10-CM

## 2019-11-20 MED ORDER — BACLOFEN 10 MG PO TABS: 5.0000 mg | ORAL_TABLET | Freq: Three times a day (TID) | ORAL | 2 refills | Status: DC | PRN

## 2019-11-20 MED ORDER — MELOXICAM 15 MG PO TABS: 15.0000 mg | ORAL_TABLET | Freq: Every day | ORAL | 2 refills | Status: DC | PRN

## 2019-11-20 MED ORDER — GABAPENTIN 100 MG PO CAPS: ORAL_CAPSULE | ORAL | 2 refills | Status: DC
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
## 2019-11-20 NOTE — Patient Instructions (Addendum)
Thank you for coming to the office today.  Referral to [facility] - for Hip pain. Prior surgical repair, concern changes to hardware over past 4+ years.  [facility] [employer] Address: [address] [place], [place], [place] [address] Hours:  9AM-5PM Phone: [phone]  Start Meloxicam 15mg  daily with food for 1-2 weeks at a time, can take longer if needed. Do not take any ibuprofen advil aleve  Recommend to start taking Tylenol Extra Strength 500mg  tabs - take 1 to 2 tabs per dose (max 1000mg ) every 6-8 hours for pain (take regularly, don't skip a dose for next 7 days), max 24 hour daily dose is 6 tablets or 3000mg . In the future you can repeat the same everyday Tylenol course for 1-2 weeks at a time.   Start taking Baclofen (Lioresal) 10mg  (muscle relaxant) - start with half (cut) to one whole pill at night as needed for next 1-3 nights (may make you drowsy, caution with driving) see how it affects you, then if tolerated increase to one pill 2 to 3 times a day or (every 8 hours as needed)  Start Gabapentin 100mg  capsules, take at night for 2-3 nights only, and then increase to 2 times a day for a few days, and then may increase to 3 times a day, it may make you drowsy, if helps significantly at night only, then you can increase instead to 3 capsules at night, instead of 3 times a day - In the future if needed, we can significantly increase the dose if tolerated well, some common doses are 300mg  three times a day up to 600mg  three times a day, usually it takes several weeks or months to get to higher doses   Please schedule a Follow-up Appointment to: Return in about 4 weeks (around 12/18/2019), or if symptoms worsen or fail to improve, for hip pain.  If you have any other questions or concerns, please feel free to call the office or send a message through MyChart. You may also schedule an earlier appointment if necessary.  Additionally, you may be receiving a  survey about your experience at our office within a few days to 1 week by e-mail or mail. We value your feedback.  , DO [facility], 

## 2019-12-17 DIAGNOSIS — M7062 Trochanteric bursitis, left hip: Secondary | ICD-10-CM | POA: Insufficient documentation

## 2020-01-19 ENCOUNTER — Telehealth: Payer: Self-pay

## 2020-01-19 NOTE — Telephone Encounter (Signed)
Left message for Shanda Bumps --to see if she has any particular forms to be filled out and most likely patient will need an appointment. He also had send you my chart message.

## 2020-01-19 NOTE — Telephone Encounter (Signed)
I have talked to him about this same problem before.  This is not something that I typically do - yes I would need more specific information.  He would need an office visit appointment to complete this type of form once we have more information. I would not schedule yet until we know more.  I would like to talk to [place] as well if you do get in touch with them.  Linde Gillis Director of Vocational Services  [facility]  [employer] [facility] Mobile: [phone]  Saralyn Pilar, DO [facility] 01/19/2020, 5:56 PM

## 2020-01-19 NOTE — Telephone Encounter (Signed)
Spoke to the patient not a good phone connection basically he wants clearance stating that he can work. Advised him to send my chart message regarding which company and how long he is been off from work and any detail information that he wants to add in his letter since he has not been seen from 11/2019.

## 2020-01-19 NOTE — Telephone Encounter (Signed)
He has asked for something similar in past. I will need to see what he requests specifically and need more information as you requested.  If it is something I can do then I will write it, otherwise he may need another office visit to discuss and formally document prior to releasing him. WIll let you know after I review any other info he sends.  Saralyn Pilar, DO [facility] 01/19/2020, 12:42 PM

## 2020-01-19 NOTE — Telephone Encounter (Signed)
Copied from CRM [id number]. 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  Patient would like to pick up documentation today.  Please advise

## 2020-01-22 ENCOUNTER — Telehealth: Payer: Self-pay

## 2020-01-22 NOTE — Telephone Encounter (Signed)
Provider left message for Derrick Gutierrez who is helping patient with disability and work--if she calls back please let us know and tell her that provider will call her back reg patient.

## 2020-01-26 ENCOUNTER — Other Ambulatory Visit: Payer: Self-pay

## 2020-01-26 ENCOUNTER — Ambulatory Visit (INDEPENDENT_AMBULATORY_CARE_PROVIDER_SITE_OTHER): Payer: Medicare Other | Admitting: Family Medicine

## 2020-01-26 ENCOUNTER — Encounter: Payer: Self-pay | Admitting: Family Medicine

## 2020-01-26 VITALS — BP 134/76 | HR 96 | Temp 97.3°F | Resp 16 | Ht 69.0 in | Wt 322.6 lb

## 2020-01-26 DIAGNOSIS — M25551 Pain in right hip: Secondary | ICD-10-CM | POA: Diagnosis not present

## 2020-01-26 DIAGNOSIS — Z6841 Body Mass Index (BMI) 40.0 and over, adult: Secondary | ICD-10-CM

## 2020-01-26 DIAGNOSIS — M25552 Pain in left hip: Secondary | ICD-10-CM | POA: Diagnosis not present

## 2020-01-26 DIAGNOSIS — F3341 Major depressive disorder, recurrent, in partial remission: Secondary | ICD-10-CM | POA: Diagnosis not present

## 2020-01-26 DIAGNOSIS — G8929 Other chronic pain: Secondary | ICD-10-CM

## 2020-01-26 MED ORDER — BACLOFEN 10 MG PO TABS: 5.0000 mg | ORAL_TABLET | Freq: Two times a day (BID) | ORAL | 1 refills | Status: DC

## 2020-01-26 MED ORDER — GABAPENTIN 400 MG PO CAPS: 400.0000 mg | ORAL_CAPSULE | Freq: Every day | ORAL | 1 refills | Status: DC

## 2020-01-26 NOTE — Patient Instructions (Addendum)
Thank you for coming to the office today.  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  Recommend calling [facility] - let them know hip injection only worked for a  Few days, but it did work very well for few days.  See what they recommend next.  Increased Muscle relaxant to twice a day, new rx with 180 pills Increased Gabapentin 100 up to 400mg  - take one a day evening preferred, not while working.  Stop Meloxicam.  Recommend to start taking Tylenol Extra Strength 500mg  tabs - take 1 to 2 tabs per dose (max 1000mg ) every 6-8 hours for pain (take regularly, don't skip a dose for next 7 days), max 24 hour daily dose is 6 tablets or 3000mg . In the future you can repeat the same everyday Tylenol course for 1-2 weeks at a time.   Send copy of your COVID Moderna vaccine dates - just type them our or call with the dates.  Please schedule a Follow-up Appointment to: Return in about 3 months (around 04/27/2020), or if symptoms worsen or fail to improve, for 3 months Hip pain.  If you have any other questions or concerns, please feel free to call the office or send a message through MyChart. You may also schedule an earlier appointment if necessary.  Additionally, you may be receiving a survey about your experience at our office within a few days to 1 week by e-mail or mail. We value your feedback.  , DO [facility], [place]
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
## 2020-03-19 ENCOUNTER — Ambulatory Visit (INDEPENDENT_AMBULATORY_CARE_PROVIDER_SITE_OTHER): Payer: Medicare Other | Admitting: Family Medicine

## 2020-03-19 ENCOUNTER — Other Ambulatory Visit: Payer: Self-pay

## 2020-03-19 ENCOUNTER — Encounter: Payer: Self-pay | Admitting: Family Medicine

## 2020-03-19 DIAGNOSIS — J4521 Mild intermittent asthma with (acute) exacerbation: Secondary | ICD-10-CM | POA: Diagnosis not present

## 2020-03-19 DIAGNOSIS — B349 Viral infection, unspecified: Secondary | ICD-10-CM | POA: Diagnosis not present

## 2020-03-19 MED ORDER — IPRATROPIUM BROMIDE 0.06 % NA SOLN: 2.0000 | Freq: Four times a day (QID) | NASAL | 0 refills | Status: DC

## 2020-03-19 MED ORDER — ALBUTEROL SULFATE HFA 108 (90 BASE) MCG/ACT IN AERS: 2.0000 | INHALATION_SPRAY | RESPIRATORY_TRACT | 2 refills | Status: DC | PRN

## 2020-03-19 MED ORDER — PREDNISONE 50 MG PO TABS: 50.0000 mg | ORAL_TABLET | Freq: Every day | ORAL | 0 refills | Status: DC

## 2020-03-19 NOTE — Patient Instructions (Addendum)
Start albuterol inhaler for asthma Start Prednisone for 5 days  WORK NOTE return 10 days after start of symptoms, return Weds 9/22, otherwise if you hear otherwise from work let me know.  You may have coronavirus / COVID19  [place] COVID19 Testing Information  COVID-19 Testing By Appointment Only  [facility] has indoor testing locations in [place], [address] and [place]. Please use the calendar below to schedule your appointment. For assistance in scheduling a COVID-19 test, please call [phone].  Online scheduling can be done online at [email or web address]  Test result may take 2-7 days to result. You will be notified by MyChart or by Phone.  May take Tylenol as needed for aches pains and fever. Prefer to avoid Ibuprofen if can help it, to avoid complication from virus.  REQUIRED self quarantine to AVOID POTENTIAL SPREAD - advised to avoid all exposure with others while during treatment. Should continue to quarantine for up to 10 days, pending resolution of symptoms, if symptoms resolve by 10 days and is afebrile >3 days - may STOP self quarantine at that time.  If symptoms do not resolve or significantly improve OR if WORSENING - fever / cough - or worsening shortness of breath - then should contact us and seek advice on next steps in treatment at home vs where/when to seek care at Urgent Care or Hospital ED for further intervention   Please schedule a Follow-up Appointment to: Return in about 1 week (around 03/26/2020), or if symptoms worsen or fail to improve, for viral / asthma / possible COVID.  If you have any other questions or concerns, please feel free to call the office or send a message through MyChart. You may also schedule an earlier appointment if necessary.  Additionally, you may be receiving a survey about your experience at our office within a few days to 1 week by e-mail or mail. We value your  feedback.  Saralyn Pilar, DO [facility], [place]

## 2020-03-19 NOTE — Progress Notes (Signed)
Virtual Visit via Telephone The purpose of this virtual visit is to provide medical care while limiting exposure to the novel coronavirus (COVID19) for both patient and office staff.  Consent was obtained for phone visit:  Yes.   Answered questions that patient had about telehealth interaction:  Yes.   I discussed the limitations, risks, security and privacy concerns of performing an evaluation and management service by telephone. I also discussed with the patient that there may be a patient responsible charge related to this service. The patient expressed understanding and agreed to proceed.  Patient Location: Home Provider Location: [facility])   ---------------------------------------------------------------------- Chief Complaint  Patient presents with  . Nasal Congestion    cough, HA, SOB denies fever --onset week    S: Reviewed CMA documentation. I have called patient and gathered additional HPI as follows:  Acute Asthma Exacerbation / VIRAL SYNDROME Reports that symptoms started about 1 week ago (first symptoms started 03/14/20) onset diarrheal illness, then developed congestion and cough headache, and some asthma flare with dyspnea. - Tried OTC DayQuil, Virtussin, NyQuil  History of asthma with some wheezing and cough, similar to asthma flare, has old inhaler needs new one, it is helping.  Denies any known or suspected exposure to person with or possibly with COVID19.  Updated on COVID19 vaccine, in chart. Last dose 11/2019  Admits cough, dyspnea Denies any fevers, chills, sweats, body ache, sinus pain or pressure, abdominal pain, diarrhea  -------------------------------------------------------------------------- O: No physical exam performed due to remote telephone encounter.  -------------------------------------------------------------------------- A&P:  Suspected Acute Viral Syndrome vs possible COVID19 Acute asthma exacerbation, comorbid  condition Afebrile, but has dyspnea wheezing with asthma  S/p COVID vaccine updated 11/2019 last dose, had gap of 4 month between No acute sick contacts known, but cannot rule out  1. Recommend COVID19 testing, see AVS for full details. 2. Rx Albuterol inhaler sent 3. Rx Prednisone 50mg  daily x 5 days 4. Use supportive care OTC meds as is, / add Start Atrovent nasal spray decongestant 2 sprays in each nostril up to 4 times daily for 7 days 5. Work note written, return on 9/22, or 10 days after symptom onset if tested negative OR if not tested.  Notify if need adjust work note, based on test results.   Meds ordered this encounter  Medications  . albuterol (PROAIR HFA) 108 (90 Base) MCG/ACT inhaler    Sig: Inhale 2 puffs into the lungs every 4 (four) hours as needed for wheezing or shortness of breath.    Dispense:  8.5 g    Refill:  2  . predniSONE (DELTASONE) 50 MG tablet    Sig: Take 1 tablet (50 mg total) by mouth daily with breakfast.    Dispense:  5 tablet    Refill:  0  . ipratropium (ATROVENT) 0.06 % nasal spray    Sig: Place 2 sprays into both nostrils 4 (four) times daily. For up to 5-7 days then stop.    Dispense:  15 mL    Refill:  0   REQUIRED self quarantine to AVOID POTENTIAL SPREAD - advised to avoid all exposure with others while during TESTING (Pending result) and treatment. Should continue to quarantine for up to 7-10 days - pending resolution of symptoms, if TEST IS NEGATIVE and symptoms resolve by 10 days and is afebrile >3 days - may STOP self quarantine at that time. IF test is POSITIVE then will require 10 additional day quarantine after date of positive test result.  If symptoms do not resolve or significantly improve OR if WORSENING - fever / cough - or worsening shortness of breath - then should contact us and seek advice on next steps in treatment at home vs where/when to seek care at Urgent Care or Hospital ED for further intervention and possible testing if  indicated.  Patient verbalizes understanding with the above medical recommendations including the limitation of remote medical advice.  Specific follow-up / call-back criteria were given for patient to follow-up or seek medical care more urgently if needed.   - Time spent in direct consultation with patient on phone: 15 minutes  Saralyn Pilar, DO [facility] 03/19/2020, 1:55 PM

## 2020-06-16 ENCOUNTER — Emergency Department: Payer: Medicare Other

## 2020-06-16 ENCOUNTER — Emergency Department
Admission: EM | Admit: 2020-06-16 | Discharge: 2020-06-16 | Disposition: A | Discharge status: Home / Self Care | Payer: Medicare Other | Attending: Emergency Medicine | Admitting: Emergency Medicine

## 2020-06-16 ENCOUNTER — Other Ambulatory Visit: Payer: Self-pay

## 2020-06-16 ENCOUNTER — Encounter: Payer: Self-pay | Admitting: Emergency Medicine

## 2020-06-16 DIAGNOSIS — I440 Atrioventricular block, first degree: Secondary | ICD-10-CM | POA: Diagnosis not present

## 2020-06-16 DIAGNOSIS — Z7951 Long term (current) use of inhaled steroids: Secondary | ICD-10-CM | POA: Insufficient documentation

## 2020-06-16 DIAGNOSIS — R079 Chest pain, unspecified: Secondary | ICD-10-CM | POA: Diagnosis not present

## 2020-06-16 DIAGNOSIS — I499 Cardiac arrhythmia, unspecified: Secondary | ICD-10-CM | POA: Diagnosis not present

## 2020-06-16 DIAGNOSIS — Z743 Need for continuous supervision: Secondary | ICD-10-CM | POA: Diagnosis not present

## 2020-06-16 DIAGNOSIS — J4520 Mild intermittent asthma, uncomplicated: Secondary | ICD-10-CM | POA: Insufficient documentation

## 2020-06-16 DIAGNOSIS — R0789 Other chest pain: Secondary | ICD-10-CM | POA: Diagnosis not present

## 2020-06-16 LAB — CBC
HCT: 37.7 % — ABNORMAL LOW (ref 39.0–52.0)
Hemoglobin: 13.3 g/dL (ref 13.0–17.0)
MCH: 30.7 pg (ref 26.0–34.0)
MCHC: 35.3 g/dL (ref 30.0–36.0)
MCV: 87.1 fL (ref 80.0–100.0)
Platelets: 273 10*3/uL (ref 150–400)
RBC: 4.33 MIL/uL (ref 4.22–5.81)
RDW: 12.8 % (ref 11.5–15.5)
WBC: 9 10*3/uL (ref 4.0–10.5)
nRBC: 0 % (ref 0.0–0.2)

## 2020-06-16 LAB — BASIC METABOLIC PANEL
Anion gap: 9 (ref 5–15)
BUN: 12 mg/dL (ref 6–20)
CO2: 23 mmol/L (ref 22–32)
Calcium: 8.9 mg/dL (ref 8.9–10.3)
Chloride: 108 mmol/L (ref 98–111)
Creatinine, Ser: 0.99 mg/dL (ref 0.61–1.24)
GFR, Estimated: >60 mL/min (ref >60)
Glucose, Bld: 110 mg/dL — ABNORMAL HIGH (ref 70–99)
Potassium: 4.1 mmol/L (ref 3.5–5.1)
Sodium: 140 mmol/L (ref 135–145)

## 2020-06-16 LAB — TROPONIN I (HIGH SENSITIVITY)
Troponin I (High Sensitivity): 3 ng/L (ref <18)
Troponin I (High Sensitivity): 3 ng/L (ref <18)

## 2020-06-16 NOTE — ED Notes (Signed)
signature pad not available

## 2020-06-16 NOTE — ED Triage Notes (Signed)
First RN Note: Pt to ED via [facility] with c/o L sided CP that radiates to L shoulder that is reproduceable with palpation. Per EMS pt denies cardiac hx, denies SOB.  2L nitro sprays 324 ASA given PTA 20g to L hand  90Hr 99% RA 141/76

## 2020-06-16 NOTE — ED Triage Notes (Signed)
Pt comes into the ED via [facility] from home c/o left side chest that radiates into his shoulder.  Pt denies any Nausea or SHOB but had minimal dizziness.  Pt in NAD at this time with even and unlabored respirations. Pt denies any cardiac history other than a heart murmur as a child.

## 2020-06-16 NOTE — ED Notes (Signed)
Pt calm , collective , denied pain or sob  
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
## 2020-09-01 ENCOUNTER — Encounter: Payer: Medicare Other | Admitting: Family Medicine

## 2020-11-09 ENCOUNTER — Telehealth (INDEPENDENT_AMBULATORY_CARE_PROVIDER_SITE_OTHER): Payer: Medicare HMO | Admitting: Family Medicine

## 2020-11-09 ENCOUNTER — Other Ambulatory Visit: Payer: Self-pay

## 2020-11-09 ENCOUNTER — Encounter: Payer: Self-pay | Admitting: Family Medicine

## 2020-11-09 VITALS — Ht 69.0 in | Wt 260.0 lb

## 2020-11-09 DIAGNOSIS — Z91199 Patient's noncompliance with other medical treatment and regimen due to unspecified reason: Secondary | ICD-10-CM

## 2020-11-09 DIAGNOSIS — J4521 Mild intermittent asthma with (acute) exacerbation: Secondary | ICD-10-CM

## 2020-11-09 DIAGNOSIS — Z5329 Procedure and treatment not carried out because of patient's decision for other reasons: Secondary | ICD-10-CM

## 2020-11-09 DIAGNOSIS — B349 Viral infection, unspecified: Secondary | ICD-10-CM

## 2020-11-09 MED ORDER — PREDNISONE 50 MG PO TABS: 50.0000 mg | ORAL_TABLET | Freq: Every day | ORAL | 0 refills | Status: DC

## 2020-11-09 NOTE — Progress Notes (Signed)
Patient did not answer phone at time of apt x 2 attempts with CMA initial call. He was not setup for mychart video visit. Phone call was requested. Randa Lynn CMA did reach him once and advised him that it would be later after next patient and to stay by phone. I attempted to call him 30 min after apt time, did not reach him again. She called again later in the morning and reached someone but patient was not available.  Considered a same day no show.  Saralyn Pilar, DO [facility] 11/09/2020, 12:57 PM

## 2020-11-15 DIAGNOSIS — M7062 Trochanteric bursitis, left hip: Secondary | ICD-10-CM | POA: Diagnosis not present

## 2020-11-15 DIAGNOSIS — M25551 Pain in right hip: Secondary | ICD-10-CM | POA: Diagnosis not present

## 2020-11-22 DIAGNOSIS — R059 Cough, unspecified: Secondary | ICD-10-CM | POA: Diagnosis not present

## 2020-11-22 DIAGNOSIS — Z20822 Contact with and (suspected) exposure to covid-19: Secondary | ICD-10-CM | POA: Diagnosis not present

## 2020-11-22 DIAGNOSIS — Z03818 Encounter for observation for suspected exposure to other biological agents ruled out: Secondary | ICD-10-CM | POA: Diagnosis not present

## 2021-01-21 ENCOUNTER — Ambulatory Visit: Payer: Medicare HMO | Admitting: Family Medicine

## 2021-02-04 ENCOUNTER — Ambulatory Visit (INDEPENDENT_AMBULATORY_CARE_PROVIDER_SITE_OTHER): Payer: Medicare HMO | Admitting: Family Medicine

## 2021-02-04 ENCOUNTER — Other Ambulatory Visit: Payer: Self-pay

## 2021-02-04 ENCOUNTER — Encounter: Payer: Self-pay | Admitting: Family Medicine

## 2021-02-04 VITALS — BP 120/81 | HR 89 | Ht 69.0 in | Wt 347.0 lb

## 2021-02-04 DIAGNOSIS — G4726 Circadian rhythm sleep disorder, shift work type: Secondary | ICD-10-CM

## 2021-02-04 DIAGNOSIS — F900 Attention-deficit hyperactivity disorder, predominantly inattentive type: Secondary | ICD-10-CM

## 2021-02-04 DIAGNOSIS — F3341 Major depressive disorder, recurrent, in partial remission: Secondary | ICD-10-CM | POA: Diagnosis not present

## 2021-02-04 MED ORDER — TRAZODONE HCL 100 MG PO TABS: 50.0000 mg | ORAL_TABLET | Freq: Every evening | ORAL | 1 refills | Status: DC | PRN

## 2021-02-04 NOTE — Progress Notes (Signed)
Subjective:    Patient ID: Derrick Gutierrez, male    DOB: 07-Jan-1981, 40 y.o.   MRN: [id number]  Derrick Gutierrez is a 40 y.o. 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 Hard to get back on sleep cycle Previously on Trazodone PRN with good results, now off med needs refill and referral back to Psychiatry Last visit with Dr Zena Amos [facility] 05/2019 He was on Strattera, Trazodone, and Buspar   Depression screen [facility] 2/9 02/04/2021 01/26/2020 09/10/2019  Decreased Interest 2 0 0  Down, Depressed, Hopeless 1 0 1  PHQ - 2 Score 3 0 1  Altered sleeping 1 1 1   Tired, decreased energy 2 1 0  Change in appetite 0 0 0  Feeling bad or failure about yourself  1 0 0  Trouble concentrating 1 0 0  Moving slowly or fidgety/restless 0 0 0  Suicidal thoughts 0 0 0  PHQ-9 Score 8 2 2   Difficult doing work/chores Not difficult at all Not difficult at all Not difficult at all  Some recent data might be hidden   GAD 7 : Generalized Anxiety Score 02/04/2021 01/26/2020 04/28/2019  Nervous, Anxious, on Edge 1 1 0  Control/stop worrying 1 0 0  Worry too much - different things 1 1 0  Trouble relaxing 1 1 0  Restless 1 1 0  Easily annoyed or irritable 1 2 0  Afraid - awful might happen 0 0 0  Total GAD 7 Score 6 6 0  Anxiety Difficulty Not difficult at all Not difficult at all Not difficult at all      Social History   Tobacco Use   Smoking status: Never   Smokeless tobacco: Never  Vaping Use   Vaping Use: Never used  Substance Use Topics   Alcohol use: No    Alcohol/week: 0.0 standard drinks   Drug use: No    Review of Systems Per HPI unless specifically indicated above     Objective:    BP 120/81   Pulse 89   Ht 5\' 9"  (1.753 m)   Wt (!) 347 lb (157.4 kg)   SpO2 95%   BMI 51.24 kg/m   Wt Readings from Last 3 Encounters:  02/04/21 (!) 347 lb (157.4 kg)  11/09/20 260 lb (117.9 kg)  06/16/20 260  lb (117.9 kg)    Physical Exam Results for orders placed or performed during the hospital encounter of 06/16/20  Basic metabolic panel  Result Value Ref Range   Sodium 140 135 - 145 mmol/L   Potassium 4.1 3.5 - 5.1 mmol/L   Chloride 108 98 - 111 mmol/L   CO2 23 22 - 32 mmol/L   Glucose, Bld 110 (H) 70 - 99 mg/dL   BUN 12 6 - 20 mg/dL   Creatinine, Ser 01/09/21 0.61 - 1.24 mg/dL   Calcium 8.9 8.9 - 06/18/20 mg/dL   GFR, Estimated 06/18/20 4.40 mL/min   Anion gap 9 5 - 15  CBC  Result Value Ref Range   WBC 9.0 4.0 - 10.5 K/uL   RBC 4.33 4.22 - 5.81 MIL/uL   Hemoglobin 13.3 13.0 - 17.0 g/dL   HCT 10.2 (L) >72 - >53 %   MCV 87.1 80.0 - 100.0 fL   MCH 30.7 26.0 - 34.0 pg   MCHC 35.3 30.0 - 36.0 g/dL   RDW 66.4 40.3 - 47.4 %   Platelets 273 150 -  400 K/uL   nRBC 0.0 0.0 - 0.2 %  Troponin I (High Sensitivity)  Result Value Ref Range   Troponin I (High Sensitivity) 3 <18 ng/L  Troponin I (High Sensitivity)  Result Value Ref Range   Troponin I (High Sensitivity) 3 <18 ng/L      Assessment & Plan:   Problem List Items Addressed This Visit     Depression, major, recurrent, in partial remission (HCC)   Relevant Medications   traZODone (DESYREL) 100 MG tablet   Attention deficit hyperactivity disorder (ADHD), predominantly inattentive type - Primary   Other Visit Diagnoses     Shift work sleep disorder       Relevant Medications   traZODone (DESYREL) 100 MG tablet       Major Depression, moderate recurrent Insomnia / Shift Work Sleep Disorder ADHD  Previously managed by Psychiatry [facility] on medication  Now needs to return to Psychiatry for further med management. Out of meds at this time. Will refill Trazodone 50-100mg  QHS PRN for insomnia He is off buspar. Previously on Straterra for ADHD, will defer to Psychiatry New referral back to [facility]   Meds ordered this encounter  Medications   traZODone (DESYREL) 100 MG tablet    Sig: Take 0.5-1 tablets (50-100 mg total) by mouth at  bedtime as needed for sleep. Start with half tablet for dose 50mg  before bed then after first 1 week or less, can increase to 1 whole tab 100mg  before bed    Dispense:  135 tablet    Refill:  1      Follow up plan: Return in about 3 months (around 05/07/2021), or if symptoms worsen or fail to improve, for 3 month as needed for follow-up mood/insomnia, psych updates.   , DO [facility] 02/04/2021, 5:11 PM

## 2021-02-04 NOTE — Patient Instructions (Addendum)
Thank you for coming to the office today.  Ordered Trazodone to your pharmacy. Stay tuned for apt  [facility]) Address: [address], [place], [place] [address] Hours: 8:30AM-5PM Phone: [phone]  Please schedule a Follow-up Appointment to: Return in about 3 months (around 05/07/2021), or if symptoms worsen or fail to improve, for 3 month as needed for follow-up mood/insomnia, psych updates.  If you have any other questions or concerns, please feel free to call the office or send a message through MyChart. You may also schedule an earlier appointment if necessary.  Additionally, you may be receiving a survey about your experience at our office within a few days to 1 week by e-mail or mail. We value your feedback.  Saralyn Pilar, DO [facility] [place]

## 2021-02-08 ENCOUNTER — Ambulatory Visit: Payer: Medicare HMO

## 2021-02-08 ENCOUNTER — Telehealth: Payer: Self-pay

## 2021-02-08 NOTE — Telephone Encounter (Signed)
This nurse attempted to call patient three times for telephonic AWV. Called at 0935, [phone], and [phone]. Message left that we will call in order to reschedule for another time.

## 2021-05-09 ENCOUNTER — Ambulatory Visit: Payer: Medicare HMO | Admitting: Family Medicine

## 2021-07-29 ENCOUNTER — Ambulatory Visit: Payer: Self-pay | Admitting: Psychiatry

## 2021-08-26 ENCOUNTER — Ambulatory Visit (INDEPENDENT_AMBULATORY_CARE_PROVIDER_SITE_OTHER): Payer: Medicare HMO

## 2021-08-26 ENCOUNTER — Telehealth: Payer: Self-pay

## 2021-08-26 DIAGNOSIS — Z0000 Encounter for general adult medical examination without abnormal findings: Secondary | ICD-10-CM | POA: Diagnosis not present
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
## 2021-08-26 NOTE — Telephone Encounter (Signed)
LVM to call back so we can do AWV over phone

## 2021-08-26 NOTE — Patient Instructions (Addendum)
Derrick Gutierrez , Thank you for taking time to come for your Medicare Wellness Visit. I appreciate your ongoing commitment to your health goals. Please review the following plan we discussed and let me know if I can assist you in the future.   Screening recommendations/referrals: Colonoscopy: n/d Recommended yearly ophthalmology/optometry visit for glaucoma screening and checkup Recommended yearly dental visit for hygiene and checkup  Vaccinations: Influenza vaccine: n/d Pneumococcal vaccine: n/d Tdap vaccine: 04/12/15 Shingles vaccine: n/d   Covid-19: 07/24/19, 11/15/19  Advanced directives: no  Conditions/risks identified: none  Next appointment: Follow up in one year for your annual wellness visit- 09/01/22 @ 1:20pm by phone  Preventive Care 40-64 Years, Male Preventive care refers to lifestyle choices and visits with your health care provider that can promote health and wellness. What does preventive care include? A yearly physical exam. This is also called an annual well check. Dental exams once or twice a year. Routine eye exams. Ask your health care provider how often you should have your eyes checked. Personal lifestyle choices, including: Daily care of your teeth and gums. Regular physical activity. Eating a healthy diet. Avoiding tobacco and drug use. Limiting alcohol use. Practicing safe sex. Taking low-dose aspirin every day starting at age 79. What happens during an annual well check? The services and screenings done by your health care provider during your annual well check will depend on your age, overall health, lifestyle risk factors, and family history of disease. Counseling  Your health care provider may ask you questions about your: Alcohol use. Tobacco use. Drug use. Emotional well-being. Home and relationship well-being. Sexual activity. Eating habits. Work and work Statistician. Screening  You may have the following tests or measurements: Height, weight,  and BMI. Blood pressure. Lipid and cholesterol levels. These may be checked every 5 years, or more frequently if you are over 50 years old. Skin check. Lung cancer screening. You may have this screening every year starting at age 2 if you have a 30-pack-year history of smoking and currently smoke or have quit within the past 15 years. Fecal occult blood test (FOBT) of the stool. You may have this test every year starting at age 10. Flexible sigmoidoscopy or colonoscopy. You may have a sigmoidoscopy every 5 years or a colonoscopy every 10 years starting at age 38. Prostate cancer screening. Recommendations will vary depending on your family history and other risks. Hepatitis C blood test. Hepatitis B blood test. Sexually transmitted disease (STD) testing. Diabetes screening. This is done by checking your blood sugar (glucose) after you have not eaten for a while (fasting). You may have this done every 1-3 years. Discuss your test results, treatment options, and if necessary, the need for more tests with your health care provider. Vaccines  Your health care provider may recommend certain vaccines, such as: Influenza vaccine. This is recommended every year. Tetanus, diphtheria, and acellular pertussis (Tdap, Td) vaccine. You may need a Td booster every 10 years. Zoster vaccine. You may need this after age 61. Pneumococcal 13-valent conjugate (PCV13) vaccine. You may need this if you have certain conditions and have not been vaccinated. Pneumococcal polysaccharide (PPSV23) vaccine. You may need one or two doses if you smoke cigarettes or if you have certain conditions. Talk to your health care provider about which screenings and vaccines you need and how often you need them. This information is not intended to replace advice given to you by your health care provider. Make sure you discuss any questions you have with  your health care provider. Document Released: 07/16/2015 Document Revised:  03/08/2016 Document Reviewed: 04/20/2015 Elsevier Interactive Patient Education  2017 [place] Prevention in the Home Falls can cause injuries. They can happen to people of all ages. There are many things you can do to make your home safe and to help prevent falls. What can I do on the outside of my home? Regularly fix the edges of walkways and driveways and fix any cracks. Remove anything that might make you trip as you walk through a door, such as a raised step or threshold. Trim any bushes or trees on the path to your home. Use bright outdoor lighting. Clear any walking paths of anything that might make someone trip, such as rocks or tools. Regularly check to see if handrails are loose or broken. Make sure that both sides of any steps have handrails. Any raised decks and porches should have guardrails on the edges. Have any leaves, snow, or ice cleared regularly. Use sand or salt on walking paths during winter. Clean up any spills in your garage right away. This includes oil or grease spills. What can I do in the bathroom? Use night lights. Install grab bars by the toilet and in the tub and shower. Do not use towel bars as grab bars. Use non-skid mats or decals in the tub or shower. If you need to sit down in the shower, use a plastic, non-slip stool. Keep the floor dry. Clean up any water that spills on the floor as soon as it happens. Remove soap buildup in the tub or shower regularly. Attach bath mats securely with double-sided non-slip rug tape. Do not have throw rugs and other things on the floor that can make you trip. What can I do in the bedroom? Use night lights. Make sure that you have a light by your bed that is easy to reach. Do not use any sheets or blankets that are too big for your bed. They should not hang down onto the floor. Have a firm chair that has side arms. You can use this for support while you get dressed. Do not have throw rugs and other things  on the floor that can make you trip. What can I do in the kitchen? Clean up any spills right away. Avoid walking on wet floors. Keep items that you use a lot in easy-to-reach places. If you need to reach something above you, use a strong step stool that has a grab bar. Keep electrical cords out of the way. Do not use floor polish or wax that makes floors slippery. If you must use wax, use non-skid floor wax. Do not have throw rugs and other things on the floor that can make you trip. What can I do with my stairs? Do not leave any items on the stairs. Make sure that there are handrails on both sides of the stairs and use them. Fix handrails that are broken or loose. Make sure that handrails are as long as the stairways. Check any carpeting to make sure that it is firmly attached to the stairs. Fix any carpet that is loose or worn. Avoid having throw rugs at the top or bottom of the stairs. If you do have throw rugs, attach them to the floor with carpet tape. Make sure that you have a light switch at the top of the stairs and the bottom of the stairs. If you do not have them, ask someone to add them for you. What else  can I do to help prevent falls? Wear shoes that: Do not have high heels. Have rubber bottoms. Are comfortable and fit you well. Are closed at the toe. Do not wear sandals. If you use a stepladder: Make sure that it is fully opened. Do not climb a closed stepladder. Make sure that both sides of the stepladder are locked into place. Ask someone to hold it for you, if possible. Clearly English and make sure that you can see: Any grab bars or handrails. First and last steps. Where the edge of each step is. Use tools that help you move around (mobility aids) if they are needed. These include: Canes. Walkers. Scooters. Crutches. Turn on the lights when you go into a dark area. Replace any light bulbs as soon as they burn out. Set up your furniture so you have a clear path. Avoid  moving your furniture around. If any of your floors are uneven, fix them. If there are any pets around you, be aware of where they are. Review your medicines with your doctor. Some medicines can make you feel dizzy. This can increase your chance of falling. Ask your doctor what other things that you can do to help prevent falls. This information is not intended to replace advice given to you by your health care provider. Make sure you discuss any questions you have with your health care provider. Document Released: 04/15/2009 Document Revised: 11/25/2015 Document Reviewed: 07/24/2014 Elsevier Interactive Patient Education  2017 [employer].

## 2021-08-31 ENCOUNTER — Ambulatory Visit: Payer: Medicare Other | Admitting: Psychiatry

## 2021-09-14 ENCOUNTER — Ambulatory Visit: Payer: Self-pay

## 2021-09-14 NOTE — Telephone Encounter (Signed)
?  Chief Complaint: pelvic pain ?Symptoms: pelvic pain in the middle ?Frequency: gotten worse last few weeks ?Pertinent Negatives: Patient denies having difficulty urinating ?Disposition: [] ED /[] Urgent Care (no appt availability in office) / [x] Appointment(In office/virtual)/ []  [place] [facility]/ [] Home Care/ [] Refused Recommended Disposition /[] [place] [facility]/ []  Follow-up with PCP ?Additional Notes:  ? ? ?Reason for Disposition ? [1] After 2 weeks AND [2] still painful or swollen ? ?Answer Assessment - Initial Assessment Questions ?2. ONSET: "When did the injury happen?" (Minutes or hours ago)  ?    Going on for years but gotten worse over last few weeks ?3. LOCATION: "Where is the injury located?"  ?    In the middle of pelvis ?5. PAIN: "Is there pain?" If Yes, ask: "How bad is the pain?"   "What does it keep you from doing?" (e.g., Scale 1-10; or mild, moderate, severe) ?  -  NONE: (0): no pain ?  -  MILD (1-3): doesn't interfere with normal activities  ?  -  MODERATE (4-7): interferes with normal activities (e.g., work or school) or awakens from sleep, limping  ?  -  SEVERE (8-10): excruciating pain, unable to do any normal activities, unable to walk ?    moderate ? ?Protocols used: Groin Injury and Strain-A-AH ? ?

## 2021-09-16 ENCOUNTER — Ambulatory Visit: Payer: Medicare Other | Admitting: Internal Medicine

## 2022-02-14 ENCOUNTER — Ambulatory Visit: Payer: Medicare HMO

## 2022-07-17 ENCOUNTER — Ambulatory Visit: Payer: Self-pay | Admitting: *Deleted

## 2022-07-17 NOTE — Telephone Encounter (Signed)
Reason for Disposition  [1] Chest pain (or "angina") comes and goes AND [2] is happening more often (increasing in frequency) or getting worse (increasing in severity)  (Exception: Chest pains that last only a few seconds.)  Answer Assessment - Initial Assessment Questions 1. LOCATION: "Where does it hurt?"       I've having random chest pains when I haven't slept well.   I work 3rd shift.   It's hard to sleep during the day.    I'm curious if that has anything to do with the pain I'm having. I'm having pain in the front of my chest, my heart beats faster and I feel a tightness then it would go away.    If I get proper rest I don't have it.   It's a cycle.    2. RADIATION: "Does the pain go anywhere else?" (e.g., into neck, jaw, arms, back)     I don't think so.   I have shoulder pain but it's my left shoulder but I hurt it a long time ago.   I don't know if it's from the chest pain or prior injury.   3. ONSET: "When did the chest pain begin?" (Minutes, hours or days)      2-3 weeks now Not coughing or chest congestion.    No swelling in feet or ankles.   I have so many things wrong with me. 4. PATTERN: "Does the pain come and go, or has it been constant since it started?"  "Does it get worse with exertion?"      It 's cycle.  5. DURATION: "How long does it last" (e.g., seconds, minutes, hours)     It lasts not that long.   Once able to relax or take Bayer aspirin it goes away. 6. SEVERITY: "How bad is the pain?"  (e.g., Scale 1-10; mild, moderate, or severe)    - MILD (1-3): doesn't interfere with normal activities     - MODERATE (4-7): interferes with normal activities or awakens from sleep    - SEVERE (8-10): excruciating pain, unable to do any normal activities       I know when you have a heart attack the pain goes down the left side of the body.    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       I know when my chest starts getting tight that's  significant.      Last Thursday is last time it happened.   Once I rest I don't have the chest pain.    I don't plan to go to the ED today.    I'll try to go tomorrow to the ED.   I'm not having chest pain now so I'm not going to the ED now.   I emphasized the importance of going on to the ED however he kept telling me he is not going because he isn't having chest pain now. 7. 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 PULMONARY RISK FACTORS: "Do you have any history of lung disease?"  (e.g., blood clots in lung, asthma, emphysema, birth control pills)     No    Denies recent illness  with URI symptoms 9. CAUSE: "What do you think is causing the chest pain?"     Lack of rest because when I get rest the pain goes away 10. OTHER SYMPTOMS: "Do you have any other symptoms?" (e.g., dizziness, nausea, vomiting, sweating, fever, difficulty breathing, cough)       Denies any other symptoms. 11. PREGNANCY: "Is there any chance you are pregnant?" "When was your last menstrual period?"       N/A  Protocols used: Chest Pain-A-AH

## 2022-07-17 NOTE — Telephone Encounter (Signed)
Acknowledged. It has been >1 year since I have seen him. Unfortunately chest pain work up is not something can do remotely and I do agree with ED evaluation for Chest Pain evaluation as he reports.  Nobie Putnam, [facility] 07/17/2022, 11:09 AM

## 2022-07-17 NOTE — Telephone Encounter (Addendum)
  Chief Complaint: Chest pain Symptoms: intermittent chest pains, tightness front of chest, heart beats faster, and left shoulder pain (but also has an old left shoulder injury so he is not sure if it's associated with the chest pain or not) then it goes away. Frequency: Random times but feels it occurs mostly when he hasn't gotten much rest because the chest pain goes away with rest.   (He works 3rd shift). Pertinent Negatives: Patient denies Having chest pain now so refusing to go to the ED.  He Disposition: [x] ED /[] Urgent Care (no appt availability in office) / [] Appointment(In office/virtual)/ []  [facility]/ [] Home Care/ [] Refused Recommended Disposition /[] [place] [facility]/ []  Follow-up with PCP Additional Notes: I have referred him to the ED per the protocol however he said he may go tomorrow.    "I'm not going now because I'm not having chest pain".   I educated him on the importance of going on now but he said,   "I'm not going today".   "Maybe I'll go tomorrow for a work up".   "I'm not having pain now".    I have sent this high priority to [facility] for Dr. Parks Ranger.   Attempted to call into practice to let them know about the refusal.

## 2022-09-05 ENCOUNTER — Ambulatory Visit: Payer: Self-pay | Admitting: *Deleted

## 2022-09-05 NOTE — Telephone Encounter (Signed)
   Chief Complaint: left leg swelling and right hip pain  Symptoms: left leg swelling in foot to thigh. Patient unsure how high swelling is. Right hip pain can walk but pain noted when walking long distances.  Frequency: greater than a month Pertinent Negatives: Patient denies chest pain no difficulty breathing no fever no redness no pain in calf Disposition: '[]'$ ED /'[]'$ Urgent Care (no appt availability in office) / '[x]'$ Appointment(In office/virtual)/ '[]'$  [place] [facility]/ '[]'$ Home Care/ '[]'$ Refused Recommended Disposition /'[]'$ [place] [facility]/ '[]'$  Follow-up with PCP Additional Notes:   Recommended if sx worsen go to ED. Patient unable to come to appt today as offered by agent. Appt already scheduled for tomorrow. Please advise if ok to keep appt for tomorrow.     Reason for Disposition  SEVERE leg swelling (e.g., swelling extends above knee, entire leg is swollen, weeping fluid)  Answer Assessment - Initial Assessment Questions 1. ONSET: "When did the swelling start?" (e.g., minutes, hours, days)     Greater than a month  2. LOCATION: "What part of the leg is swollen?"  "Are both legs swollen or just one leg?"     Up to thigh, more in foot 3. SEVERITY: "How bad is the swelling?" (e.g., localized; mild, moderate, severe)   - Localized: Small area of swelling localized to one leg.   - MILD pedal edema: Swelling limited to foot and ankle, pitting edema < 1/4 inch (6 mm) deep, rest and elevation eliminate most or all swelling.   - MODERATE edema: Swelling of lower leg to knee, pitting edema > 1/4 inch (6 mm) deep, rest and elevation only partially reduce swelling.   - SEVERE edema: Swelling extends above knee, facial or hand swelling present.      Reports swelling more in left foot but extends up to thigh. Patient unsure of how high swelling is  4. REDNESS: "Does the swelling look red or infected?"     No  5. PAIN: "Is the swelling painful to touch?" If Yes, ask: "How painful is it?"    (Scale 1-10; mild, moderate or severe)     Pain 8 with walking longer distances  6. FEVER: "Do you have a fever?" If Yes, ask: "What is it, how was it measured, and when did it start?"      na 7. CAUSE: "What do you think is causing the leg swelling?"     Not sure maybe due to hip surgery years ago . C/o right hip pain 8. MEDICAL HISTORY: "Do you have a history of blood clots (e.g., DVT), cancer, heart failure, kidney disease, or liver failure?"     No  9. RECURRENT SYMPTOM: "Have you had leg swelling before?" If Yes, ask: "When was the last time?" "What happened that time?"     Water on knee 10. OTHER SYMPTOMS: "Do you have any other symptoms?" (e.g., chest pain, difficulty breathing)       No  11. PREGNANCY: "Is there any chance you are pregnant?" "When was your last menstrual period?"       na  Protocols used: Leg Swelling and Edema-A-AH

## 2022-09-06 ENCOUNTER — Encounter: Payer: Self-pay | Admitting: Family Medicine

## 2022-09-06 ENCOUNTER — Ambulatory Visit (INDEPENDENT_AMBULATORY_CARE_PROVIDER_SITE_OTHER): Payer: Medicare Other | Admitting: Family Medicine

## 2022-09-06 VITALS — BP 130/84 | HR 88 | Ht 69.0 in | Wt 351.0 lb

## 2022-09-06 DIAGNOSIS — M1652 Unilateral post-traumatic osteoarthritis, left hip: Secondary | ICD-10-CM

## 2022-09-06 DIAGNOSIS — M7062 Trochanteric bursitis, left hip: Secondary | ICD-10-CM | POA: Diagnosis not present

## 2022-09-06 DIAGNOSIS — R7309 Other abnormal glucose: Secondary | ICD-10-CM

## 2022-09-06 DIAGNOSIS — G8929 Other chronic pain: Secondary | ICD-10-CM

## 2022-09-06 DIAGNOSIS — M25551 Pain in right hip: Secondary | ICD-10-CM | POA: Diagnosis not present

## 2022-09-06 DIAGNOSIS — M25552 Pain in left hip: Secondary | ICD-10-CM | POA: Diagnosis not present

## 2022-09-06 DIAGNOSIS — Z6841 Body Mass Index (BMI) 40.0 and over, adult: Secondary | ICD-10-CM

## 2022-09-06 MED ORDER — MELOXICAM 15 MG PO TABS: 15.0000 mg | ORAL_TABLET | Freq: Every day | ORAL | 1 refills | Status: DC | PRN

## 2022-09-06 NOTE — Patient Instructions (Addendum)
Thank you for coming to the office today.  Restart Meloxicam '15mg'$  daily for anti inflammatory for hip pain., recommend this med 1-2 weeks at a time then pause. Stop taking Ibuprofen. Okay to take Tylenol  1-2 weeks OFF Meloxicam, use the topical instead. START anti inflammatory topical - OTC Voltaren (generic Diclofenac) topical 2-4 times a day as needed for pain swelling of affected joint for 1-2 weeks or longer.  Request copy of records X-ray from [facility]  ---------------------------  Call insurance find cost and coverage of the following - check the following: - Drug Tier, Preferred List, On Formulary - All will require a "Prior Authorization" from [place] first, before you can find out the cost - Find out if there is "Step Therapy" (other medicines required before you can try these)  Once you pick the one you want to try, let me know - we can get a sample ready IF we have it in stock. Then try it - and before running out of medicine, contact me back to order your Rx so we have time to get it processed.  For Weight Loss / Obesity only  Zepbound (same as Mounjaro) weekly injection  Wegovy (same as Ozempic) weekly injection - start 0.'25mg'$  weekly, 1 dose per pen, single use, auto-injector  3. Saxenda - DAILY injection - start 0.'6mg'$  injection DAILY, you can increase the dose by 1 notch or 0.6 mg per week, if you don't tolerate a dose, can reduce it the next day.  4. Contrave - oral medication, appetite suppression has wellbutrin/bupropion and naltrexone in it and it can also help with appetite, it is ordered through a speciality pharmacy.   Future make sure your insurance has weight loss coverage    Please schedule a Follow-up Appointment to: Return in about 3 months (around 12/07/2022), or if symptoms worsen or fail to improve.  If you have any other questions or concerns, please feel free to call the office or send a message through [place]. You may also schedule an earlier  appointment if necessary.  Additionally, you may be receiving a survey about your experience at our office within a few days to 1 week by e-mail or mail. We value your feedback.  Nobie Putnam, DO [place]
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
## 2022-09-07 LAB — CBC WITH DIFFERENTIAL/PLATELET
Absolute Monocytes: 415 cells/uL (ref 200–950)
Basophils Absolute: 17 cells/uL (ref 0–200)
Basophils Relative: 0.2 %
Eosinophils Absolute: 42 cells/uL (ref 15–500)
Eosinophils Relative: 0.5 %
HCT: 38.8 % (ref 38.5–50.0)
Hemoglobin: 13.5 g/dL (ref 13.2–17.1)
Lymphs Abs: 2083 cells/uL (ref 850–3900)
MCH: 30.1 pg (ref 27.0–33.0)
MCHC: 34.8 g/dL (ref 32.0–36.0)
MCV: 86.4 fL (ref 80.0–100.0)
MPV: 8.9 fL (ref 7.5–12.5)
Monocytes Relative: 5 %
Neutro Abs: 5744 cells/uL (ref 1500–7800)
Neutrophils Relative %: 69.2 %
Platelets: 322 10*3/uL (ref 140–400)
RBC: 4.49 10*6/uL (ref 4.20–5.80)
RDW: 13.6 % (ref 11.0–15.0)
Total Lymphocyte: 25.1 %
WBC: 8.3 10*3/uL (ref 3.8–10.8)

## 2022-09-07 LAB — COMPLETE METABOLIC PANEL WITH GFR
AG Ratio: 1.3 (calc) (ref 1.0–2.5)
ALT: 14 U/L (ref 9–46)
AST: 16 U/L (ref 10–40)
Albumin: 4.4 g/dL (ref 3.6–5.1)
Alkaline phosphatase (APISO): 104 U/L (ref 36–130)
BUN: 14 mg/dL (ref 7–25)
CO2: 25 mmol/L (ref 20–32)
Calcium: 9.7 mg/dL (ref 8.6–10.3)
Chloride: 106 mmol/L (ref 98–110)
Creat: 0.95 mg/dL (ref 0.60–1.29)
Globulin: 3.4 g/dL (calc) (ref 1.9–3.7)
Glucose, Bld: 96 mg/dL (ref 65–99)
Potassium: 4.2 mmol/L (ref 3.5–5.3)
Sodium: 138 mmol/L (ref 135–146)
Total Bilirubin: 0.4 mg/dL (ref 0.2–1.2)
Total Protein: 7.8 g/dL (ref 6.1–8.1)
eGFR: 102 mL/min/{1.73_m2} (ref >=60)

## 2022-09-07 LAB — HEMOGLOBIN A1C
Hgb A1c MFr Bld: 5.9 % of total Hgb — ABNORMAL HIGH (ref <5.7)
Mean Plasma Glucose: 123 mg/dL
eAG (mmol/L): 6.8 mmol/L

## 2022-09-08 ENCOUNTER — Encounter: Payer: Self-pay | Admitting: Family Medicine

## 2022-09-08 DIAGNOSIS — R7309 Other abnormal glucose: Secondary | ICD-10-CM | POA: Insufficient documentation

## 2022-09-22 ENCOUNTER — Telehealth: Payer: Self-pay | Admitting: Family Medicine

## 2022-09-22 NOTE — Telephone Encounter (Signed)
Called patient to schedule Medicare Annual Wellness Visit (AWV). Left message for patient to call back and schedule Medicare Annual Wellness Visit (AWV).  Last date of AWV: 08/26/21  Please schedule an appointment at any time with Kirke Shaggy, LPN after [id number] on AWV schedule. .  If any questions, please contact me at [facility]; [place] Direct Dial: [phone] .  Thank you ,  Sherol Dade; [place] Direct Dial: [phone]

## 2023-03-30 ENCOUNTER — Ambulatory Visit: Payer: Self-pay | Admitting: *Deleted

## 2023-03-30 NOTE — Telephone Encounter (Signed)
Reason for Disposition  [1] MILD pain (e.g., does not interfere with normal activities) AND [2] present > 7 days  Answer Assessment - Initial Assessment Questions 1. ONSET: "When did the pain start?"     Comes and goes- increased with job 2. LOCATION: "Where is the pain located?"     Back of shoulder and chest- Left 3. PAIN: "How bad is the pain?" (Scale 1-10; or mild, moderate, severe)   - MILD (1-3): doesn't interfere with normal activities   - MODERATE (4-7): interferes with normal activities (e.g., work or school) or awakens from sleep   - SEVERE (8-10): excruciating pain, unable to do any normal activities, unable to move arm at all due to pain     Dull pain - mild 4. WORK OR EXERCISE: "Has there been any recent work or exercise that involved this part of the body?"     Patient is Academic librarian- recent change 5. CAUSE: "What do you think is causing the shoulder pain?"     Change in job- aggravating shoulder  6. OTHER SYMPTOMS: "Do you have any other symptoms?" (e.g., neck pain, swelling, rash, fever, numbness, weakness)     no  Protocols used: Shoulder Pain-A-AH

## 2023-03-30 NOTE — Telephone Encounter (Signed)
  Chief Complaint: Left shoulder pain Symptoms: worse with movement- lifting at job- then gets better with days off.  Frequency: ongoing- but worse with job duty change Pertinent Negatives: Patient denies neck pain, swelling, rash, fever, numbness, weakness)  Disposition: [] ED /[] Urgent Care (no appt availability in office) / [x] Appointment(In office/virtual)/ []  [place] [facility]/ [] Home Care/ [] Refused Recommended Disposition /[] [place] [facility]/ []  Follow-up with PCP Additional Notes: Patient advised per protocol- appointment has been scheduled-will call back if his symptoms should change/worsen.

## 2023-04-06 ENCOUNTER — Ambulatory Visit: Payer: Medicare Other | Admitting: Family Medicine

## 2023-04-11 NOTE — Telephone Encounter (Signed)
Copied from CRM [id number]. Topic: Medicare AWV >> Apr 11, 2023  9:36 AM Payton Doughty wrote: Reason for CRM: Called LVM 04/11/2023 to schedule AWV   Verlee Rossetti; Care Guide Ambulatory Clinical Support [place] [facility] Direct Dial: [phone]

## 2023-07-24 ENCOUNTER — Encounter: Payer: Self-pay | Admitting: Family Medicine

## 2023-07-24 ENCOUNTER — Ambulatory Visit (INDEPENDENT_AMBULATORY_CARE_PROVIDER_SITE_OTHER): Payer: Self-pay | Admitting: Family Medicine

## 2023-07-24 VITALS — BP 130/72 | HR 96 | Ht 69.0 in | Wt 368.0 lb

## 2023-07-24 DIAGNOSIS — Z23 Encounter for immunization: Secondary | ICD-10-CM | POA: Diagnosis not present

## 2023-07-24 DIAGNOSIS — E7800 Pure hypercholesterolemia, unspecified: Secondary | ICD-10-CM

## 2023-07-24 DIAGNOSIS — M25551 Pain in right hip: Secondary | ICD-10-CM | POA: Diagnosis not present

## 2023-07-24 DIAGNOSIS — Z6841 Body Mass Index (BMI) 40.0 and over, adult: Secondary | ICD-10-CM

## 2023-07-24 DIAGNOSIS — Z634 Disappearance and death of family member: Secondary | ICD-10-CM

## 2023-07-24 DIAGNOSIS — M1652 Unilateral post-traumatic osteoarthritis, left hip: Secondary | ICD-10-CM

## 2023-07-24 DIAGNOSIS — R7303 Prediabetes: Secondary | ICD-10-CM | POA: Insufficient documentation

## 2023-07-24 DIAGNOSIS — F332 Major depressive disorder, recurrent severe without psychotic features: Secondary | ICD-10-CM | POA: Diagnosis not present

## 2023-07-24 DIAGNOSIS — G8929 Other chronic pain: Secondary | ICD-10-CM

## 2023-07-24 DIAGNOSIS — R351 Nocturia: Secondary | ICD-10-CM

## 2023-07-24 DIAGNOSIS — M25552 Pain in left hip: Secondary | ICD-10-CM
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
## 2023-07-24 NOTE — Patient Instructions (Addendum)
Thank you for coming to the office today.  Check into coverage We[employer] This one should be covered under Medicaid [email or web address]   Zepbound TO Check Cost & Coverage of Zepbound Please contact Research officer, trade union (manufacturer for [employer]) 1-800-LillyRx (504)827-0070) - Live agent to discuss cost and coverage.   ----------------------------------------  For Weight Loss / Obesity only  Contrave - oral medication, appetite suppression has wellbutrin/bupropion and naltrexone in it and it can also help with appetite, it is ordered through a speciality pharmacy. - $99 per month  Free sample 7 day, 1 pill per day for 1 week  Alternative options  Semaglutide injection (mixed Ozempic) from [facility] 0.25mg  weekly for 4 weeks then increase to 0.5mg  weekly It comes in a vial and a needle syringe, you need to draw up the shot and self admin it weekly Cost is about $200 per month Call them to check pricing and availability  [facility] Address: [address] [place], [place], [place] [address] Phone: [phone]  --------------------------------  [facility] Online Virtual Doctor Team With insurance $79/mo + copay for medications Without insurance $79/mo + $99/mo to include medication (Semaglutide) Without insurance $79/mo + $199/mo to include medication (Tirzepatide) [email or web address]    You have been referred for a Coronary Calcium Score Cardiac CT Scan. This is a screening test for patients aged 49-50+ with cardiovascular risk factors or who are healthy but would be interested in Cardiovascular Screening for heart disease. Even if there is a family history of heart disease, this imaging can be useful. Typically it can be done every 5+ years or at a different timeline we agree on  The scan will look at the chest and mainly focus on the heart and identify early signs of calcium build up  or blockages within the heart arteries. It is not 100% accurate for identifying blockages or heart disease, but it is useful to help [place] predict who may have some early changes or be at risk in the future for a heart attack or cardiovascular problem.  The results are reviewed by a Cardiologist and they will document the results. It should become available on MyChart. Typically the results are divided into percentiles based on other patients of the same demographic and age. So it will compare your risk to others similar to you. If you have a higher score >99 or higher percentile >75%tile, it is recommended to consider Statin cholesterol therapy and or referral to Cardiologist. I will try to help explain your results and if we have questions we can contact the Cardiologist.  You will be contacted for scheduling. Usually it is done at any imaging facility through [facility], [facility] or [facility].  The cost is $99 flat fee total and it does not go through insurance, so no authorization is required.    DUE for FASTING BLOOD WORK (no food or drink after midnight before the lab appointment, only water or coffee without cream/sugar on the morning of)  SCHEDULE "Lab Only" visit in the morning at the clinic for lab draw tomorrow  - Make sure Lab Only appointment is at about 1 week before your next appointment, so that results will be available  For Lab Results, once available within 2-3 days of blood draw, you can can log in to MyChart online to view your results and a brief explanation. Also, we can discuss results at next follow-up visit.   Please schedule a Follow-up Appointment to: Return in about 4  months (around 11/21/2023) for 4 month weight check, PreDM A1c.  If you have any other questions or concerns, please feel free to call the office or send a message through MyChart. You may also schedule an earlier appointment if necessary.  Additionally, you may be  receiving a survey about your experience at our office within a few days to 1 week by e-mail or mail. We value your feedback.  Saralyn Pilar, DO [facility], [place]

## 2023-07-25 ENCOUNTER — Other Ambulatory Visit: Payer: Medicare Other

## 2023-11-21 ENCOUNTER — Ambulatory Visit: Payer: Self-pay | Admitting: Family Medicine

## 2024-05-24 ENCOUNTER — Other Ambulatory Visit: Payer: Self-pay

## 2024-05-24 ENCOUNTER — Emergency Department: Payer: No Typology Code available for payment source

## 2024-05-24 ENCOUNTER — Emergency Department
Admission: EM | Admit: 2024-05-24 | Discharge: 2024-05-24 | Disposition: A | Discharge status: Home / Self Care | Payer: Self-pay | Attending: Emergency Medicine | Admitting: Emergency Medicine

## 2024-05-24 DIAGNOSIS — J181 Lobar pneumonia, unspecified organism: Secondary | ICD-10-CM | POA: Insufficient documentation

## 2024-05-24 DIAGNOSIS — R42 Dizziness and giddiness: Secondary | ICD-10-CM | POA: Insufficient documentation

## 2024-05-24 DIAGNOSIS — J189 Pneumonia, unspecified organism: Secondary | ICD-10-CM

## 2024-05-24 DIAGNOSIS — J45909 Unspecified asthma, uncomplicated: Secondary | ICD-10-CM | POA: Insufficient documentation

## 2024-05-24 LAB — CBC
HCT: 38.6 % — ABNORMAL LOW (ref 39.0–52.0)
Hemoglobin: 13.2 g/dL (ref 13.0–17.0)
MCH: 30.3 pg (ref 26.0–34.0)
MCHC: 34.2 g/dL (ref 30.0–36.0)
MCV: 88.7 fL (ref 80.0–100.0)
Platelets: 276 K/uL (ref 150–400)
RBC: 4.35 MIL/uL (ref 4.22–5.81)
RDW: 12.9 % (ref 11.5–15.5)
WBC: 9.9 K/uL (ref 4.0–10.5)
nRBC: 0 % (ref 0.0–0.2)

## 2024-05-24 LAB — BASIC METABOLIC PANEL WITH GFR
Anion gap: 11 (ref 5–15)
BUN: 13 mg/dL (ref 6–20)
CO2: 23 mmol/L (ref 22–32)
Calcium: 9.1 mg/dL (ref 8.9–10.3)
Chloride: 105 mmol/L (ref 98–111)
Creatinine, Ser: 0.85 mg/dL (ref 0.61–1.24)
GFR, Estimated: >60 mL/min (ref >60)
Glucose, Bld: 93 mg/dL (ref 70–99)
Potassium: 4.1 mmol/L (ref 3.5–5.1)
Sodium: 139 mmol/L (ref 135–145)

## 2024-05-24 LAB — TROPONIN T, HIGH SENSITIVITY: Troponin T High Sensitivity: <15 ng/L (ref 0–19)

## 2024-05-24 MED ORDER — AZITHROMYCIN 250 MG PO TABS: 250.0000 mg | ORAL_TABLET | Freq: Every day | ORAL | 0 refills | Status: AC

## 2024-05-24 MED ORDER — AZITHROMYCIN 500 MG PO TABS
500.0000 mg | ORAL_TABLET | Freq: Once | ORAL | Status: AC
  Administered 2024-05-24: 500 mg via ORAL
  Filled 2024-05-24: qty 1

## 2024-05-24 NOTE — Discharge Instructions (Signed)
 Your exam, labs, EKG, and chest ray overall reassuring.  X-ray does show a however, evidence of a early developing pneumonia to the left chest.  This may be the source of some your symptoms.  Take the prescription antibiotic daily as prescribed.  Consider taking OTC Tylenol , Motrin , or cough medicine if needed.  Follow-up with your primary provider for ongoing evaluation.  Return to the ED for worsening symptoms.

## 2024-05-24 NOTE — ED Notes (Signed)
 PT endorses pain to L anterior chest, radiating to L arm. Had nausea earlier today. Took tylenol  earlier today.

## 2024-05-24 NOTE — ED Provider Notes (Signed)
 [facility] Emergency Department Provider Note     Event Date/Time   First MD Initiated Contact with Patient 05/24/24 1713     (approximate)   History   Chest Pain   HPI  Derrick Gutierrez is a 43 y.o. male with a history of asthma, anxiety, ADHD, obesity, and seizure disorder, presents to the ED endorsing some left-sided chest pain that started this morning.  Patient reports onset of symptoms after he got out of the shower.  Also reports feeling intermittently dizzy but denies any frank syncope, nausea, vomiting, or vision change.  Patient presents to the ED in no acute distress, for evaluation of his injuries.  He did use his inhaler this morning with limited benefit.  Physical Exam   Triage Vital Signs: ED Triage Vitals  Encounter Vitals Group     BP 05/24/24 1631 130/82     Girls Systolic BP Percentile --      Girls Diastolic BP Percentile --      Boys Systolic BP Percentile --      Boys Diastolic BP Percentile --      Pulse Rate 05/24/24 1631 98     Resp 05/24/24 1631 (!) 27     Temp 05/24/24 1631 98.6 F (37 C)     Temp Source 05/24/24 1631 Oral     SpO2 05/24/24 1631 99 %     Weight --      Height 05/24/24 1639 5' 9 (1.753 m)     Head Circumference --      Peak Flow --      Pain Score 05/24/24 1639 7     Pain Loc --      Pain Education --      Exclude from Growth Chart --     Most recent vital signs: Vitals:   05/24/24 1713 05/24/24 1804  BP: 123/74 (!) 132/52  Pulse: 77 78  Resp: 20 20  Temp: 98.1 F (36.7 C) 98 F (36.7 C)  SpO2:  98%    General Awake, no distress. NAD HEENT NCAT. PERRL. EOMI. No rhinorrhea. Mucous membranes are moist. CV:  Good peripheral perfusion. RRR.  No CCE distally RESP:  Normal effort. CTA ABD:  No distention.    ED Results / Procedures / Treatments   Labs (all labs ordered are listed, but only abnormal results are displayed) Labs Reviewed  CBC - Abnormal; Notable for the following  components:      Result Value   HCT 38.6 (*)    All other components within normal limits  BASIC METABOLIC PANEL WITH GFR  TROPONIN T, HIGH SENSITIVITY  TROPONIN T, HIGH SENSITIVITY    EKG  Vent. rate 95 BPM PR interval 200 ms QRS duration 76 ms QT/QTcB 340/427 ms P-R-T axes 37 108 48 Normal sinus rhythm Rightward axis Cannot rule out Anterior infarct (cited on or before 16-Jun-2020) Abnormal ECG When compared with ECG of 16-Jun-2020 19:39, No significant change was found  RADIOLOGY  I personally viewed and evaluated these images as part of my medical decision making, as well as reviewing the written report by the radiologist.  ED Provider Interpretation: Questionable left perihilar densities concerning for early infiltrate  DG Chest 2 View Result Date: 05/24/2024 CLINICAL DATA:  Shortness of breath. EXAM: CHEST - 2 VIEW COMPARISON:  Chest radiograph dated 06/16/2020. FINDINGS: Left perihilar densities may represent bronchial thickening or infiltrate. No focal consolidation, pleural effusion, pneumothorax. The cardiac silhouette is within normal limits. No acute  osseous pathology. IMPRESSION: Left perihilar densities may represent bronchial thickening or infiltrate. Electronically Signed   By: Vanetta Chou M.D.   On: 05/24/2024 17:03    PROCEDURES:  Critical Care performed: No  Procedures   MEDICATIONS ORDERED IN ED: Medications  azithromycin  (ZITHROMAX ) tablet 500 mg (500 mg Oral Given 05/24/24 1803)     IMPRESSION / MDM / ASSESSMENT AND PLAN / ED COURSE  I reviewed the triage vital signs and the nursing notes.                              Differential diagnosis includes, but is not limited to,  ACS, aortic dissection, pulmonary embolism, cardiac tamponade, pneumothorax, pneumonia, pericarditis, myocarditis, GI-related causes including esophagitis/gastritis, and musculoskeletal chest wall pain.    Patient's presentation is most consistent with acute  complicated illness / injury requiring diagnostic workup.  Patient's diagnosis is consistent with CAP.  Patient presents to the ED with left-sided chest pain onset this morning, without provocation.  Exam is overall reassuring.  Patient in no acute respiratory distress.  He is not tachycardic, tachypneic, and with symptoms aggravated by cough and respirations.  His labs overall are reassuring with no acute lab abnormalities.  Troponin is normal x 1 with low concern for acute ACS.  No EKG evidence of malignant arrhythmia.  Patient's chest x-ray, interpreted by me, shows concerning left upper lobe consolidation.  Patient will be treated empirically for CAP.  Patient will be discharged home with prescriptions for azithromycin . Patient is to follow up with his primary provider as suggested, as needed or otherwise directed. Patient is given ED precautions to return to the ED for any worsening or new symptoms.     FINAL CLINICAL IMPRESSION(S) / ED DIAGNOSES   Final diagnoses:  Community acquired pneumonia of left upper lobe of lung     Rx / DC Orders   ED Discharge Orders          Ordered    azithromycin  (ZITHROMAX  Z-PAK) 250 MG tablet  Daily        05/24/24 1802             Note:  This document was prepared using Dragon voice recognition software and may include unintentional dictation errors.    Loyd Candida LULLA Aldona, PA-C 05/24/24 1833    Dorothyann Drivers, MD 05/24/24 [phone]

## 2024-05-24 NOTE — ED Triage Notes (Addendum)
 Pt c/o dizziness and left sided CP that started this morning after taking a shower. Pt denies cardiac history. Pt AOX4, NAD noted. Pt used inhaler this morning.

## 2024-06-29 ENCOUNTER — Emergency Department: Admission: EM | Admit: 2024-06-29 | Discharge: 2024-06-30 | Disposition: A | Payer: Self-pay

## 2024-06-29 ENCOUNTER — Emergency Department: Payer: Self-pay

## 2024-06-29 ENCOUNTER — Other Ambulatory Visit: Payer: Self-pay

## 2024-06-29 DIAGNOSIS — J45909 Unspecified asthma, uncomplicated: Secondary | ICD-10-CM | POA: Insufficient documentation

## 2024-06-29 DIAGNOSIS — R002 Palpitations: Secondary | ICD-10-CM | POA: Insufficient documentation

## 2024-06-29 DIAGNOSIS — R55 Syncope and collapse: Secondary | ICD-10-CM | POA: Insufficient documentation

## 2024-06-29 DIAGNOSIS — R42 Dizziness and giddiness: Secondary | ICD-10-CM | POA: Insufficient documentation

## 2024-06-29 LAB — BASIC METABOLIC PANEL WITH GFR
Anion gap: 14 (ref 5–15)
BUN: 13 mg/dL (ref 6–20)
CO2: 21 mmol/L — ABNORMAL LOW (ref 22–32)
Calcium: 9.3 mg/dL (ref 8.9–10.3)
Chloride: 101 mmol/L (ref 98–111)
Creatinine, Ser: 0.93 mg/dL (ref 0.61–1.24)
GFR, Estimated: >60 mL/min
Glucose, Bld: 125 mg/dL — ABNORMAL HIGH (ref 70–99)
Potassium: 3.9 mmol/L (ref 3.5–5.1)
Sodium: 137 mmol/L (ref 135–145)

## 2024-06-29 LAB — RESP PANEL BY RT-PCR (RSV, FLU A&B, COVID)  RVPGX2
Influenza A by PCR: NEGATIVE
Influenza B by PCR: NEGATIVE
Resp Syncytial Virus by PCR: NEGATIVE
SARS Coronavirus 2 by RT PCR: NEGATIVE

## 2024-06-29 LAB — CBC
HCT: 39.9 % (ref 39.0–52.0)
Hemoglobin: 13.6 g/dL (ref 13.0–17.0)
MCH: 30.1 pg (ref 26.0–34.0)
MCHC: 34.1 g/dL (ref 30.0–36.0)
MCV: 88.3 fL (ref 80.0–100.0)
Platelets: 281 K/uL (ref 150–400)
RBC: 4.52 MIL/uL (ref 4.22–5.81)
RDW: 13 % (ref 11.5–15.5)
WBC: 11.9 K/uL — ABNORMAL HIGH (ref 4.0–10.5)
nRBC: 0 % (ref 0.0–0.2)

## 2024-06-29 LAB — TROPONIN T, HIGH SENSITIVITY: Troponin T High Sensitivity: <15 ng/L (ref 0–19)

## 2024-06-29 MED ORDER — MECLIZINE HCL 25 MG PO TABS
25.0000 mg | ORAL_TABLET | Freq: Once | ORAL | Status: AC
  Administered 2024-06-30: 25 mg via ORAL
  Filled 2024-06-29: qty 1

## 2024-06-29 MED ORDER — SODIUM CHLORIDE 0.9 % IV BOLUS
500.0000 mL | Freq: Once | INTRAVENOUS | Status: AC
  Administered 2024-06-30: 500 mL via INTRAVENOUS

## 2024-06-29 NOTE — ED Provider Notes (Signed)
 "  [facility] Provider Note    Event Date/Time   First MD Initiated Contact with Patient 06/29/24 2302     (approximate)   History   Dizziness  Pt come with dizziness and headches for two days. Pt states he fainted today.  Pt also states on and off cp mid sternal. Pt states some sob also.    HPI Derrick Gutierrez is a 43 y.o. male PMH asthma, ADHD, anxiety, seizures presents with multiple complaints including dizziness, headache, chest discomfort, syncope, shortness of breath - felt intermittent palpitations and mild chest pressure today w/ mild HA, gradual onset, self resolved. - around 5pm felt lightheaded, fell to ground, did not LOC. No headstrike.  - Currently asymptomatic - Denies any recent infectious symptoms.  Some tinnitus to right ear.  No pain in either ears, no ear drainage, no fevers. - No history of DVT/PE, no recent surgery/excessive travel, no hormone use, no leg swelling, no pleuritic discomfort  Per chart review, last seen in our emergency department on 05/24/2024, found to have pneumonia, treated with antibiotics.     Physical Exam   Triage Vital Signs: ED Triage Vitals  Encounter Vitals Group     BP 06/29/24 1809 (!) 147/86     Girls Systolic BP Percentile --      Girls Diastolic BP Percentile --      Boys Systolic BP Percentile --      Boys Diastolic BP Percentile --      Pulse Rate 06/29/24 1809 (!) 107     Resp 06/29/24 1809 20     Temp 06/29/24 1809 97.8 F (36.6 C)     Temp src --      SpO2 06/29/24 1809 97 %     Weight 06/29/24 1808 300 lb (136.1 kg)     Height 06/29/24 1808 5' 9 (1.753 m)     Head Circumference --      Peak Flow --      Pain Score 06/29/24 1808 2     Pain Loc --      Pain Education --      Exclude from Growth Chart --     Most recent vital signs: Vitals:   06/29/24 1809 06/29/24 2246  BP: (!) 147/86   Pulse: (!) 107 (!) 107  Resp: 20   Temp: 97.8 F (36.6 C)   SpO2: 97%       General: Awake, no distress.  CV:  Good peripheral perfusion. RRR (HR 90s), RP 2+ Resp:  Normal effort. CTAB Abd:  No distention. Nontender to deep palpation throughout Neuro:  Aox4, CN II-XII intact, FNF wnl, finger taps fast b/l, 5/5 strength in bilateral finger extension/grip, arm flexion/extension, EHL/FHL. BUE AG 10+ sec no drift, BLE AG 5+ sec no drift. Ambulates with steady gait. SILT. Negative Rhomberg.    ED Results / Procedures / Treatments   Labs (all labs ordered are listed, but only abnormal results are displayed) Labs Reviewed  BASIC METABOLIC PANEL WITH GFR - Abnormal; Notable for the following components:      Result Value   CO2 21 (*)    Glucose, Bld 125 (*)    All other components within normal limits  CBC - Abnormal; Notable for the following components:   WBC 11.9 (*)    All other components within normal limits  RESP PANEL BY RT-PCR (RSV, FLU A&B, COVID)  RVPGX2  D-DIMER, QUANTITATIVE  TROPONIN T, HIGH SENSITIVITY  TROPONIN T, HIGH SENSITIVITY  EKG  See ED course below   RADIOLOGY Radiology interpreted by myself and radiology report reviewed.  No acute pathology identified.    PROCEDURES:  Critical Care performed: {CriticalCareYesNo:19197::Yes, see critical care procedure note(s),No}  Procedures   MEDICATIONS ORDERED IN ED: Medications  meclizine  (ANTIVERT ) tablet 25 mg (has no administration in time range)  sodium chloride  0.9 % bolus 500 mL (has no administration in time range)     IMPRESSION / MDM / ASSESSMENT AND PLAN / ED COURSE  I reviewed the triage vital signs and the nursing notes.                              DDX/MDM/AP: Differential diagnosis includes, but is not limited to, transient arrhythmia, consider underlying anemia or electrolyte abnormality, consider possible dehydration/orthostasis.  Considered but doubt PE.  Regarding transient vertigo, suspect peripheral etiology, nonfocal neuroexam here, do not  suspect stroke at this time.  No red flags on headache history and has since resolved, not suspect acute intracranial pathology.  Plan: - Labs  -EKG - Chest x-ray Small bolus IV fluid - Meclizine  - reassess   Patient's presentation is most consistent with acute presentation with potential threat to life or bodily function.  The patient is on the cardiac monitor to evaluate for evidence of arrhythmia and/or significant heart rate changes.  ED course below. ***  Clinical Course as of 06/29/24 2330  Sun Jun 29, 2024  2309 CBC with mild leukocytosis, labs unremarkable BMP reviewed, unremarkable  [MM]  2310 Troponin negative Viral swab negative [MM]  2310 CXR: IMPRESSION: Improved left perihilar opacities from prior exam. No new or progressive airspace disease.   [MM]  2310 Ecg = sinus tachycardia, rate 104, no gross ST elevation or depression, no significant repolarization abnormality, rightward axis, normal intervals.  No clear evidence of ischemia no arrhythmia my interpretation. [MM]    Clinical Course User Index [MM] Clarine Ozell LABOR, MD     FINAL CLINICAL IMPRESSION(S) / ED DIAGNOSES   Final diagnoses:  None     Rx / DC Orders   ED Discharge Orders     None        Note:  This document was prepared using Dragon voice recognition software and may include unintentional dictation errors. "

## 2024-06-29 NOTE — ED Triage Notes (Addendum)
 Pt come with dizziness and headches for two days. Pt states he fainted today.  Pt also states on and off cp mid sternal. Pt states some sob also.

## 2024-06-30 LAB — D-DIMER, QUANTITATIVE: D-Dimer, Quant: <0.27 ug{FEU}/mL (ref 0.00–0.50)

## 2024-06-30 LAB — TROPONIN T, HIGH SENSITIVITY: Troponin T High Sensitivity: <15 ng/L (ref 0–19)

## 2024-06-30 MED ORDER — MECLIZINE HCL 12.5 MG PO TABS: 12.5000 mg | ORAL_TABLET | Freq: Three times a day (TID) | ORAL | 0 refills | Status: AC | PRN

## 2024-06-30 NOTE — Discharge Instructions (Addendum)
 Your evaluation in the emergency department was overall reassuring. We saw no concerning findings today, I suspect you may have an issue with your inner ear causing episodes of vertigo.  I prescribed you dizziness medication to use as needed.  You preferred not to follow-up with a cardiologist, but I do recommend you at least follow-up with a primary care provider for reevaluation.  Return to the emergency department with any new or worsening symptoms.

## 2024-07-04 ENCOUNTER — Ambulatory Visit: Payer: Self-pay

## 2024-07-04 NOTE — Telephone Encounter (Signed)
 Left message for patient to return call OK to advise  appointment has been changed to see DR Edman on Monday at 11 am

## 2024-07-04 NOTE — Telephone Encounter (Signed)
 This is not appropriate.  He is being seen for shortness of breath wheezing, chronic back and knee pain.  He has been to the ER twice, once for pneumonia and syncope.  With all of these concerns he needs to see his PCP.  I recommend scheduling with Dr. Edman Tuesday, January 6 11:20
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
## 2024-07-07 ENCOUNTER — Ambulatory Visit: Payer: Self-pay | Admitting: Family Medicine

## 2024-07-07 ENCOUNTER — Encounter: Payer: Self-pay | Admitting: Family Medicine

## 2024-07-07 ENCOUNTER — Ambulatory Visit: Payer: Self-pay | Admitting: Internal Medicine

## 2024-07-07 VITALS — BP 132/84 | HR 90 | Ht 69.0 in | Wt 377.0 lb

## 2024-07-07 DIAGNOSIS — M1652 Unilateral post-traumatic osteoarthritis, left hip: Secondary | ICD-10-CM

## 2024-07-07 DIAGNOSIS — F331 Major depressive disorder, recurrent, moderate: Secondary | ICD-10-CM

## 2024-07-07 DIAGNOSIS — M25551 Pain in right hip: Secondary | ICD-10-CM

## 2024-07-07 DIAGNOSIS — M25552 Pain in left hip: Secondary | ICD-10-CM

## 2024-07-07 DIAGNOSIS — J4540 Moderate persistent asthma, uncomplicated: Secondary | ICD-10-CM

## 2024-07-07 DIAGNOSIS — Z6841 Body Mass Index (BMI) 40.0 and over, adult: Secondary | ICD-10-CM

## 2024-07-07 DIAGNOSIS — G8929 Other chronic pain: Secondary | ICD-10-CM

## 2024-07-07 DIAGNOSIS — M7062 Trochanteric bursitis, left hip: Secondary | ICD-10-CM

## 2024-07-07 MED ORDER — PREDNISONE 20 MG PO TABS: ORAL_TABLET | ORAL | 0 refills | Status: AC

## 2024-07-07 MED ORDER — MELOXICAM 15 MG PO TABS: 15.0000 mg | ORAL_TABLET | Freq: Every day | ORAL | 2 refills | Status: AC | PRN

## 2024-07-07 MED ORDER — GABAPENTIN 300 MG PO CAPS: 300.0000 mg | ORAL_CAPSULE | Freq: Every day | ORAL | 2 refills | Status: AC

## 2024-07-07 MED ORDER — GABAPENTIN 300 MG PO CAPS: 300.0000 mg | ORAL_CAPSULE | Freq: Every day | ORAL | 2 refills | Status: DC
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
## 2024-07-07 NOTE — Patient Instructions (Addendum)
 Thank you for coming to the office today.  Referral to our VBCI program for assistance with medication and coverage. Case worker with social work and [employer]  With [employer], they will call you. If not heard back in 2 weeks you can call us  to check status.  Use Albuterol  as needed for breathing  Start Prednisone  taper 7 day You may take Tylenol  as needed with this  Once you finish Prednisone  You can start back on Meloxicam  as needed for pain and inflammation 1 per day max as needed You can take Tylenol  with this.  Gabapentin  is a nerve pain pill 300mg  taken nightly to help with chronic pain and joint pain It can cause some drowsiness and side effect so best to take at bedtime. If needed we can increase to take some during day if needed.  Please schedule a Follow-up Appointment to: Return if symptoms worsen or fail to improve.  If you have any other questions or concerns, please feel free to call the office or send a message through MyChart. You may also schedule an earlier appointment if necessary.  Additionally, you may be receiving a survey about your experience at our office within a few days to 1 week by e-mail or mail. We value your feedback.  Marsa Officer, DO [facility], [place]

## 2024-07-10 ENCOUNTER — Telehealth: Payer: Self-pay

## 2024-07-10 NOTE — Progress Notes (Unsigned)
 Complex Care Management Note Care Guide Note  07/10/2024 Name: Nayel Purdy MRN: [id number] DOB: Jun 13, 1981   Complex Care Management Outreach Attempts: An unsuccessful telephone outreach was attempted today to offer the patient information about available complex care management services.  Follow Up Plan:  Additional outreach attempts will be made to offer the patient complex care management information and services.   Encounter Outcome:  wrong number   Jeoffrey Buffalo , RMA     [place]  [facility], [facility] Guide  Direct Dial: [phone]  Website: [email or web address]
